# Patient Record
Sex: Female | Born: 1950 | Race: White | Hispanic: No | Marital: Married | Smoking: Never smoker
Health system: Southern US, Community
[De-identification: ages and names within clinical notes are randomized; demographics above are authoritative.]

## PROBLEM LIST (undated history)

## (undated) DIAGNOSIS — I10 Essential (primary) hypertension: Secondary | ICD-10-CM

## (undated) DIAGNOSIS — H5340 Unspecified visual field defects: Secondary | ICD-10-CM

## (undated) DIAGNOSIS — N809 Endometriosis, unspecified: Secondary | ICD-10-CM

## (undated) DIAGNOSIS — M791 Myalgia, unspecified site: Secondary | ICD-10-CM

## (undated) DIAGNOSIS — E785 Hyperlipidemia, unspecified: Secondary | ICD-10-CM

## (undated) DIAGNOSIS — R739 Hyperglycemia, unspecified: Secondary | ICD-10-CM

## (undated) DIAGNOSIS — Q430 Meckel's diverticulum (displaced) (hypertrophic): Secondary | ICD-10-CM

## (undated) DIAGNOSIS — H348392 Tributary (branch) retinal vein occlusion, unspecified eye, stable: Secondary | ICD-10-CM

## (undated) HISTORY — DX: Essential (primary) hypertension: I10

## (undated) HISTORY — DX: Hyperlipidemia, unspecified: E78.5

## (undated) HISTORY — DX: Hyperglycemia, unspecified: R73.9

## (undated) HISTORY — DX: Myalgia, unspecified site: M79.10

## (undated) HISTORY — DX: Tributary (branch) retinal vein occlusion, unspecified eye, stable: H34.8392

## (undated) HISTORY — DX: Meckel's diverticulum (displaced) (hypertrophic): Q43.0

## (undated) HISTORY — DX: Unspecified visual field defects: H53.40

## (undated) HISTORY — PX: ABDOMINAL HYSTERECTOMY: SHX81

---
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
## 1979-01-09 HISTORY — PX: BREAST LUMPECTOMY: SHX2

## 1998-12-12 ENCOUNTER — Encounter: Admission: RE | Admit: 1998-12-12 | Discharge: 1998-12-12 | Payer: Self-pay | Admitting: Psychiatry

## 1998-12-12 ENCOUNTER — Encounter: Payer: Self-pay | Admitting: Family Medicine

## 1999-01-18 ENCOUNTER — Other Ambulatory Visit: Admission: RE | Admit: 1999-01-18 | Discharge: 1999-01-18 | Payer: Self-pay | Admitting: Family Medicine

## 1999-12-27 ENCOUNTER — Encounter: Payer: Self-pay | Admitting: Family Medicine

## 1999-12-27 ENCOUNTER — Encounter: Admission: RE | Admit: 1999-12-27 | Discharge: 1999-12-27 | Payer: Self-pay | Admitting: Family Medicine

## 2002-10-20 ENCOUNTER — Encounter: Admission: RE | Admit: 2002-10-20 | Discharge: 2002-10-20 | Payer: Self-pay | Admitting: Family Medicine

## 2002-10-20 ENCOUNTER — Encounter: Payer: Self-pay | Admitting: Family Medicine

## 2004-09-21 ENCOUNTER — Encounter: Admission: RE | Admit: 2004-09-21 | Discharge: 2004-09-21 | Payer: Self-pay | Admitting: Family Medicine

## 2004-09-29 ENCOUNTER — Other Ambulatory Visit: Admission: RE | Admit: 2004-09-29 | Discharge: 2004-09-29 | Payer: Self-pay | Admitting: Family Medicine

## 2004-10-20 ENCOUNTER — Encounter: Admission: RE | Admit: 2004-10-20 | Discharge: 2004-10-20 | Payer: Self-pay | Admitting: Family Medicine

## 2005-02-28 ENCOUNTER — Emergency Department (HOSPITAL_COMMUNITY): Admission: EM | Admit: 2005-02-28 | Discharge: 2005-02-28 | Payer: Self-pay | Admitting: Emergency Medicine

## 2005-09-14 ENCOUNTER — Ambulatory Visit: Payer: Self-pay | Admitting: Family Medicine

## 2005-11-15 ENCOUNTER — Ambulatory Visit (HOSPITAL_COMMUNITY): Admission: RE | Admit: 2005-11-15 | Discharge: 2005-11-15 | Payer: Self-pay | Admitting: Family Medicine

## 2005-12-14 ENCOUNTER — Ambulatory Visit: Payer: Self-pay | Admitting: Family Medicine

## 2005-12-24 ENCOUNTER — Encounter: Payer: Self-pay | Admitting: Cardiology

## 2005-12-24 ENCOUNTER — Ambulatory Visit: Payer: Self-pay

## 2006-01-09 ENCOUNTER — Ambulatory Visit: Payer: Self-pay | Admitting: Family Medicine

## 2006-01-09 LAB — CONVERTED CEMR LAB
AST: 24 units/L (ref 0–37)
Albumin: 3.8 g/dL (ref 3.5–5.2)
Chloride: 106 meq/L (ref 96–112)
Creatinine, Ser: 0.9 mg/dL (ref 0.4–1.2)
GFR calc non Af Amer: 69 mL/min
Hgb A1c MFr Bld: 5.9 % (ref 4.6–6.0)
Potassium: 4 meq/L (ref 3.5–5.1)
Sodium: 141 meq/L (ref 135–145)

## 2006-04-18 ENCOUNTER — Ambulatory Visit: Payer: Self-pay | Admitting: Family Medicine

## 2006-09-16 ENCOUNTER — Ambulatory Visit: Payer: Self-pay | Admitting: Family Medicine

## 2006-09-16 DIAGNOSIS — E785 Hyperlipidemia, unspecified: Secondary | ICD-10-CM

## 2006-09-16 DIAGNOSIS — R7309 Other abnormal glucose: Secondary | ICD-10-CM | POA: Insufficient documentation

## 2006-09-16 DIAGNOSIS — I10 Essential (primary) hypertension: Secondary | ICD-10-CM | POA: Insufficient documentation

## 2006-09-19 LAB — CONVERTED CEMR LAB
ALT: 29 units/L (ref 0–35)
Albumin: 3.8 g/dL (ref 3.5–5.2)
BUN: 16 mg/dL (ref 6–23)
Bilirubin, Direct: 0.1 mg/dL (ref 0.0–0.3)
Calcium: 9.4 mg/dL (ref 8.4–10.5)
Chloride: 110 meq/L (ref 96–112)
Cholesterol: 205 mg/dL (ref 0–200)
GFR calc Af Amer: 95 mL/min
HDL: 37.2 mg/dL — ABNORMAL LOW (ref >39.0)
Hgb A1c MFr Bld: 6 % (ref 4.6–6.0)
Total Protein: 6.9 g/dL (ref 6.0–8.3)
Triglycerides: 289 mg/dL (ref 0–149)
VLDL: 58 mg/dL — ABNORMAL HIGH (ref 0–40)

## 2006-11-18 ENCOUNTER — Telehealth (INDEPENDENT_AMBULATORY_CARE_PROVIDER_SITE_OTHER): Payer: Self-pay | Admitting: *Deleted

## 2006-12-18 ENCOUNTER — Ambulatory Visit (HOSPITAL_COMMUNITY): Admission: RE | Admit: 2006-12-18 | Discharge: 2006-12-18 | Payer: Self-pay | Admitting: Family Medicine

## 2006-12-19 ENCOUNTER — Telehealth (INDEPENDENT_AMBULATORY_CARE_PROVIDER_SITE_OTHER): Payer: Self-pay | Admitting: *Deleted

## 2006-12-30 ENCOUNTER — Encounter (INDEPENDENT_AMBULATORY_CARE_PROVIDER_SITE_OTHER): Payer: Self-pay | Admitting: *Deleted

## 2007-02-17 ENCOUNTER — Ambulatory Visit: Payer: Self-pay | Admitting: Family Medicine

## 2007-02-25 ENCOUNTER — Ambulatory Visit: Payer: Self-pay | Admitting: Family Medicine

## 2007-03-12 LAB — CONVERTED CEMR LAB
ALT: 31 units/L (ref 0–35)
Albumin: 3.7 g/dL (ref 3.5–5.2)
Alkaline Phosphatase: 64 units/L (ref 39–117)
Bilirubin, Direct: 0.1 mg/dL (ref 0.0–0.3)
CO2: 27 meq/L (ref 19–32)
Calcium: 9.5 mg/dL (ref 8.4–10.5)
Cholesterol: 193 mg/dL (ref 0–200)
Direct LDL: 63.6 mg/dL
Hgb A1c MFr Bld: 6 % (ref 4.6–6.0)
Total CHOL/HDL Ratio: 5.1

## 2007-07-09 ENCOUNTER — Telehealth (INDEPENDENT_AMBULATORY_CARE_PROVIDER_SITE_OTHER): Payer: Self-pay | Admitting: *Deleted

## 2007-07-16 ENCOUNTER — Ambulatory Visit: Payer: Self-pay | Admitting: Family Medicine

## 2007-07-27 LAB — CONVERTED CEMR LAB
Albumin: 3.7 g/dL (ref 3.5–5.2)
Alkaline Phosphatase: 71 units/L (ref 39–117)
Calcium: 9.3 mg/dL (ref 8.4–10.5)
Creatinine, Ser: 0.8 mg/dL (ref 0.4–1.2)
GFR calc Af Amer: 95 mL/min
GFR calc non Af Amer: 79 mL/min
Glucose, Bld: 108 mg/dL — ABNORMAL HIGH (ref 70–99)
HDL: 37.8 mg/dL — ABNORMAL LOW (ref >39.0)
Potassium: 4 meq/L (ref 3.5–5.1)
Total Bilirubin: 0.7 mg/dL (ref 0.3–1.2)
Total CHOL/HDL Ratio: 5.9
Total Protein: 6.5 g/dL (ref 6.0–8.3)

## 2007-07-28 ENCOUNTER — Encounter (INDEPENDENT_AMBULATORY_CARE_PROVIDER_SITE_OTHER): Payer: Self-pay | Admitting: *Deleted

## 2007-08-15 ENCOUNTER — Telehealth (INDEPENDENT_AMBULATORY_CARE_PROVIDER_SITE_OTHER): Payer: Self-pay | Admitting: *Deleted

## 2007-11-28 ENCOUNTER — Telehealth (INDEPENDENT_AMBULATORY_CARE_PROVIDER_SITE_OTHER): Payer: Self-pay | Admitting: *Deleted

## 2008-01-12 ENCOUNTER — Encounter (INDEPENDENT_AMBULATORY_CARE_PROVIDER_SITE_OTHER): Payer: Self-pay | Admitting: *Deleted

## 2008-01-28 ENCOUNTER — Ambulatory Visit: Payer: Self-pay | Admitting: Family Medicine

## 2008-01-28 DIAGNOSIS — H5340 Unspecified visual field defects: Secondary | ICD-10-CM | POA: Insufficient documentation

## 2008-01-28 DIAGNOSIS — IMO0001 Reserved for inherently not codable concepts without codable children: Secondary | ICD-10-CM | POA: Insufficient documentation

## 2008-01-29 LAB — CONVERTED CEMR LAB
Alkaline Phosphatase: 57 units/L (ref 39–117)
BUN: 19 mg/dL (ref 6–23)
Basophils Relative: 2.3 % (ref 0.0–3.0)
Bilirubin, Direct: 0.1 mg/dL (ref 0.0–0.3)
Cholesterol: 239 mg/dL (ref 0–200)
Direct LDL: 90 mg/dL
Eosinophils Absolute: 0.1 10*3/uL (ref 0.0–0.7)
Eosinophils Relative: 2.1 % (ref 0.0–5.0)
GFR calc Af Amer: 83 mL/min
GFR calc non Af Amer: 68 mL/min
Glucose, Bld: 108 mg/dL — ABNORMAL HIGH (ref 70–99)
HCT: 39.2 % (ref 36.0–46.0)
MCHC: 34.9 g/dL (ref 30.0–36.0)
MCV: 88.9 fL (ref 78.0–100.0)
Monocytes Relative: 8.6 % (ref 3.0–12.0)
Neutrophils Relative %: 58.3 % (ref 43.0–77.0)
Platelets: 309 10*3/uL (ref 150–400)
RDW: 12.9 % (ref 11.5–14.6)
Total Bilirubin: 0.9 mg/dL (ref 0.3–1.2)
Total Protein: 6.5 g/dL (ref 6.0–8.3)
WBC: 5.7 10*3/uL (ref 4.5–10.5)

## 2008-01-30 ENCOUNTER — Encounter (INDEPENDENT_AMBULATORY_CARE_PROVIDER_SITE_OTHER): Payer: Self-pay | Admitting: *Deleted

## 2008-01-30 ENCOUNTER — Telehealth (INDEPENDENT_AMBULATORY_CARE_PROVIDER_SITE_OTHER): Payer: Self-pay | Admitting: *Deleted

## 2008-02-06 ENCOUNTER — Encounter: Payer: Self-pay | Admitting: Family Medicine

## 2008-02-10 ENCOUNTER — Telehealth: Payer: Self-pay | Admitting: Family Medicine

## 2008-02-18 ENCOUNTER — Telehealth (INDEPENDENT_AMBULATORY_CARE_PROVIDER_SITE_OTHER): Payer: Self-pay | Admitting: *Deleted

## 2008-03-10 ENCOUNTER — Ambulatory Visit: Payer: Self-pay | Admitting: Family Medicine

## 2008-03-18 ENCOUNTER — Encounter (INDEPENDENT_AMBULATORY_CARE_PROVIDER_SITE_OTHER): Payer: Self-pay | Admitting: *Deleted

## 2008-07-19 ENCOUNTER — Telehealth (INDEPENDENT_AMBULATORY_CARE_PROVIDER_SITE_OTHER): Payer: Self-pay | Admitting: *Deleted

## 2008-09-28 ENCOUNTER — Ambulatory Visit (HOSPITAL_COMMUNITY): Admission: RE | Admit: 2008-09-28 | Discharge: 2008-09-28 | Payer: Self-pay | Admitting: Family Medicine

## 2008-11-11 ENCOUNTER — Encounter: Payer: Self-pay | Admitting: Family Medicine

## 2008-11-11 ENCOUNTER — Ambulatory Visit: Payer: Self-pay | Admitting: Family Medicine

## 2008-11-11 ENCOUNTER — Other Ambulatory Visit: Admission: RE | Admit: 2008-11-11 | Discharge: 2008-11-11 | Payer: Self-pay | Admitting: Family Medicine

## 2008-11-11 DIAGNOSIS — H348392 Tributary (branch) retinal vein occlusion, unspecified eye, stable: Secondary | ICD-10-CM

## 2008-11-11 DIAGNOSIS — E559 Vitamin D deficiency, unspecified: Secondary | ICD-10-CM | POA: Insufficient documentation

## 2008-11-11 DIAGNOSIS — Q430 Meckel's diverticulum (displaced) (hypertrophic): Secondary | ICD-10-CM

## 2008-11-11 LAB — HM PAP SMEAR

## 2008-11-12 ENCOUNTER — Encounter: Payer: Self-pay | Admitting: Family Medicine

## 2008-11-15 ENCOUNTER — Encounter (INDEPENDENT_AMBULATORY_CARE_PROVIDER_SITE_OTHER): Payer: Self-pay | Admitting: *Deleted

## 2008-11-17 ENCOUNTER — Telehealth (INDEPENDENT_AMBULATORY_CARE_PROVIDER_SITE_OTHER): Payer: Self-pay | Admitting: *Deleted

## 2009-02-21 ENCOUNTER — Telehealth (INDEPENDENT_AMBULATORY_CARE_PROVIDER_SITE_OTHER): Payer: Self-pay | Admitting: *Deleted

## 2009-05-30 ENCOUNTER — Telehealth (INDEPENDENT_AMBULATORY_CARE_PROVIDER_SITE_OTHER): Payer: Self-pay | Admitting: *Deleted

## 2009-06-03 ENCOUNTER — Ambulatory Visit: Payer: Self-pay | Admitting: Family Medicine

## 2009-06-09 LAB — CONVERTED CEMR LAB
ALT: 42 units/L — ABNORMAL HIGH (ref 0–35)
Albumin: 4 g/dL (ref 3.5–5.2)
BUN: 18 mg/dL (ref 6–23)
Bilirubin, Direct: 0 mg/dL (ref 0.0–0.3)
Cholesterol: 183 mg/dL (ref 0–200)
Creatinine, Ser: 0.8 mg/dL (ref 0.4–1.2)
Hgb A1c MFr Bld: 6.1 % (ref 4.6–6.5)
Sodium: 144 meq/L (ref 135–145)
Total Bilirubin: 0.3 mg/dL (ref 0.3–1.2)
Total Protein: 6.7 g/dL (ref 6.0–8.3)

## 2009-10-18 ENCOUNTER — Encounter: Payer: Self-pay | Admitting: Family Medicine

## 2009-10-19 ENCOUNTER — Telehealth (INDEPENDENT_AMBULATORY_CARE_PROVIDER_SITE_OTHER): Payer: Self-pay | Admitting: *Deleted

## 2009-10-19 ENCOUNTER — Encounter: Payer: Self-pay | Admitting: Family Medicine

## 2009-10-19 ENCOUNTER — Encounter (INDEPENDENT_AMBULATORY_CARE_PROVIDER_SITE_OTHER): Payer: Self-pay | Admitting: *Deleted

## 2009-10-19 ENCOUNTER — Ambulatory Visit: Payer: Self-pay | Admitting: Family Medicine

## 2009-10-19 DIAGNOSIS — R002 Palpitations: Secondary | ICD-10-CM

## 2009-10-19 DIAGNOSIS — R05 Cough: Secondary | ICD-10-CM

## 2009-10-19 DIAGNOSIS — R059 Cough, unspecified: Secondary | ICD-10-CM | POA: Insufficient documentation

## 2009-10-20 ENCOUNTER — Ambulatory Visit (HOSPITAL_COMMUNITY): Admission: RE | Admit: 2009-10-20 | Discharge: 2009-10-20 | Payer: Self-pay | Admitting: Family Medicine

## 2009-10-20 LAB — CONVERTED CEMR LAB
ALT: 25 units/L (ref 0–35)
Alkaline Phosphatase: 55 units/L (ref 39–117)
Bilirubin, Direct: 0.1 mg/dL (ref 0.0–0.3)
Cholesterol: 160 mg/dL (ref 0–200)
GFR calc non Af Amer: 68.78 mL/min (ref >60)
HCT: 39.4 % (ref 36.0–46.0)
HDL: 42 mg/dL (ref >39.00)
Hemoglobin: 13.4 g/dL (ref 12.0–15.0)
Lymphocytes Relative: 33.4 % (ref 12.0–46.0)
Lymphs Abs: 2.3 10*3/uL (ref 0.7–4.0)
MCHC: 34.1 g/dL (ref 30.0–36.0)
MCV: 90 fL (ref 78.0–100.0)
Monocytes Relative: 8.9 % (ref 3.0–12.0)
Neutrophils Relative %: 55.6 % (ref 43.0–77.0)
Platelets: 342 10*3/uL (ref 150.0–400.0)
Potassium: 4.1 meq/L (ref 3.5–5.1)
RBC: 4.38 M/uL (ref 3.87–5.11)
RDW: 13.9 % (ref 11.5–14.6)
T3, Free: 3 pg/mL (ref 2.3–4.2)
TSH: 1.54 microintl units/mL (ref 0.35–5.50)
Total Bilirubin: 0.3 mg/dL (ref 0.3–1.2)
Total CHOL/HDL Ratio: 4
Total Protein: 6.7 g/dL (ref 6.0–8.3)
Triglycerides: 198 mg/dL — ABNORMAL HIGH (ref 0.0–149.0)
VLDL: 39.6 mg/dL (ref 0.0–40.0)
Vitamin B-12: 338 pg/mL (ref 211–911)
WBC: 6.8 10*3/uL (ref 4.5–10.5)

## 2009-10-20 IMAGING — CR DG CHEST 2V
2 series · 2 of 2 positions shown · non-contrast
Comparison: None.

CLINICAL DATA: Cough.  Fever.  Palpitations.

CHEST - 2 VIEW [DATE]:

[view not recorded (1 of 2)]
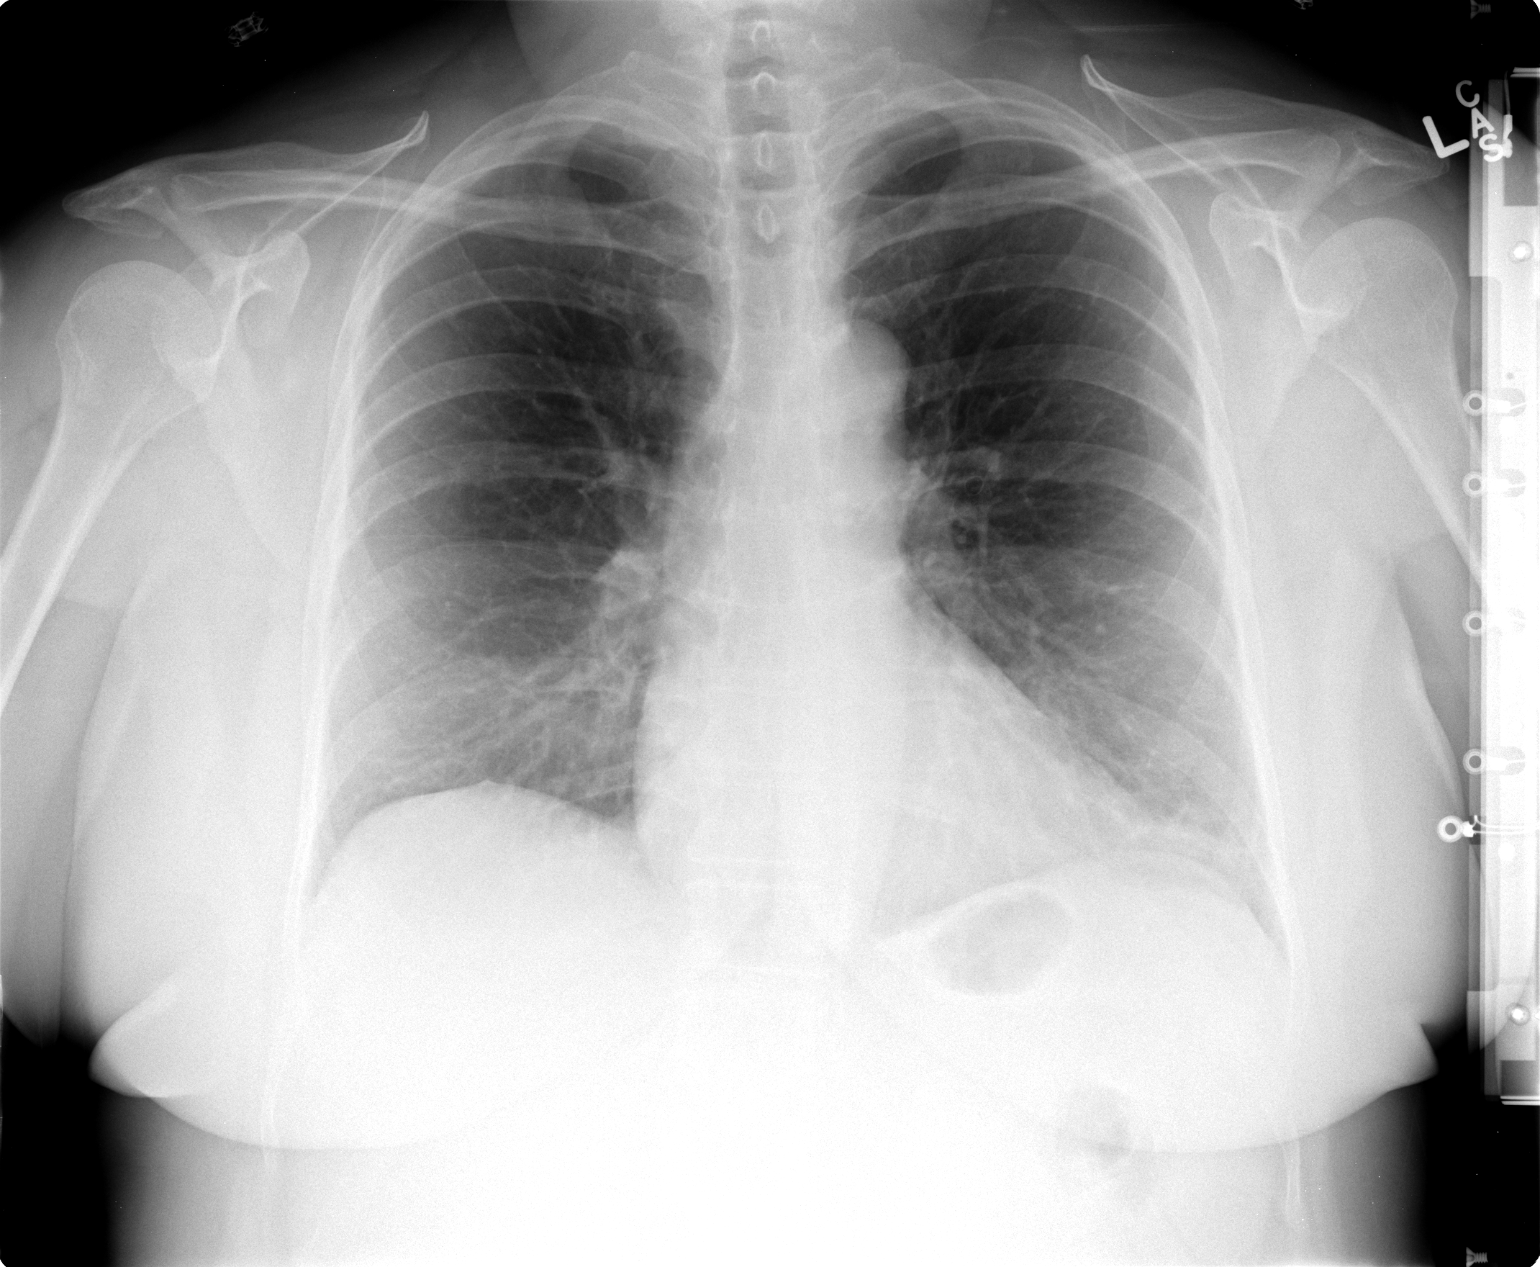

[view not recorded (2 of 2)]
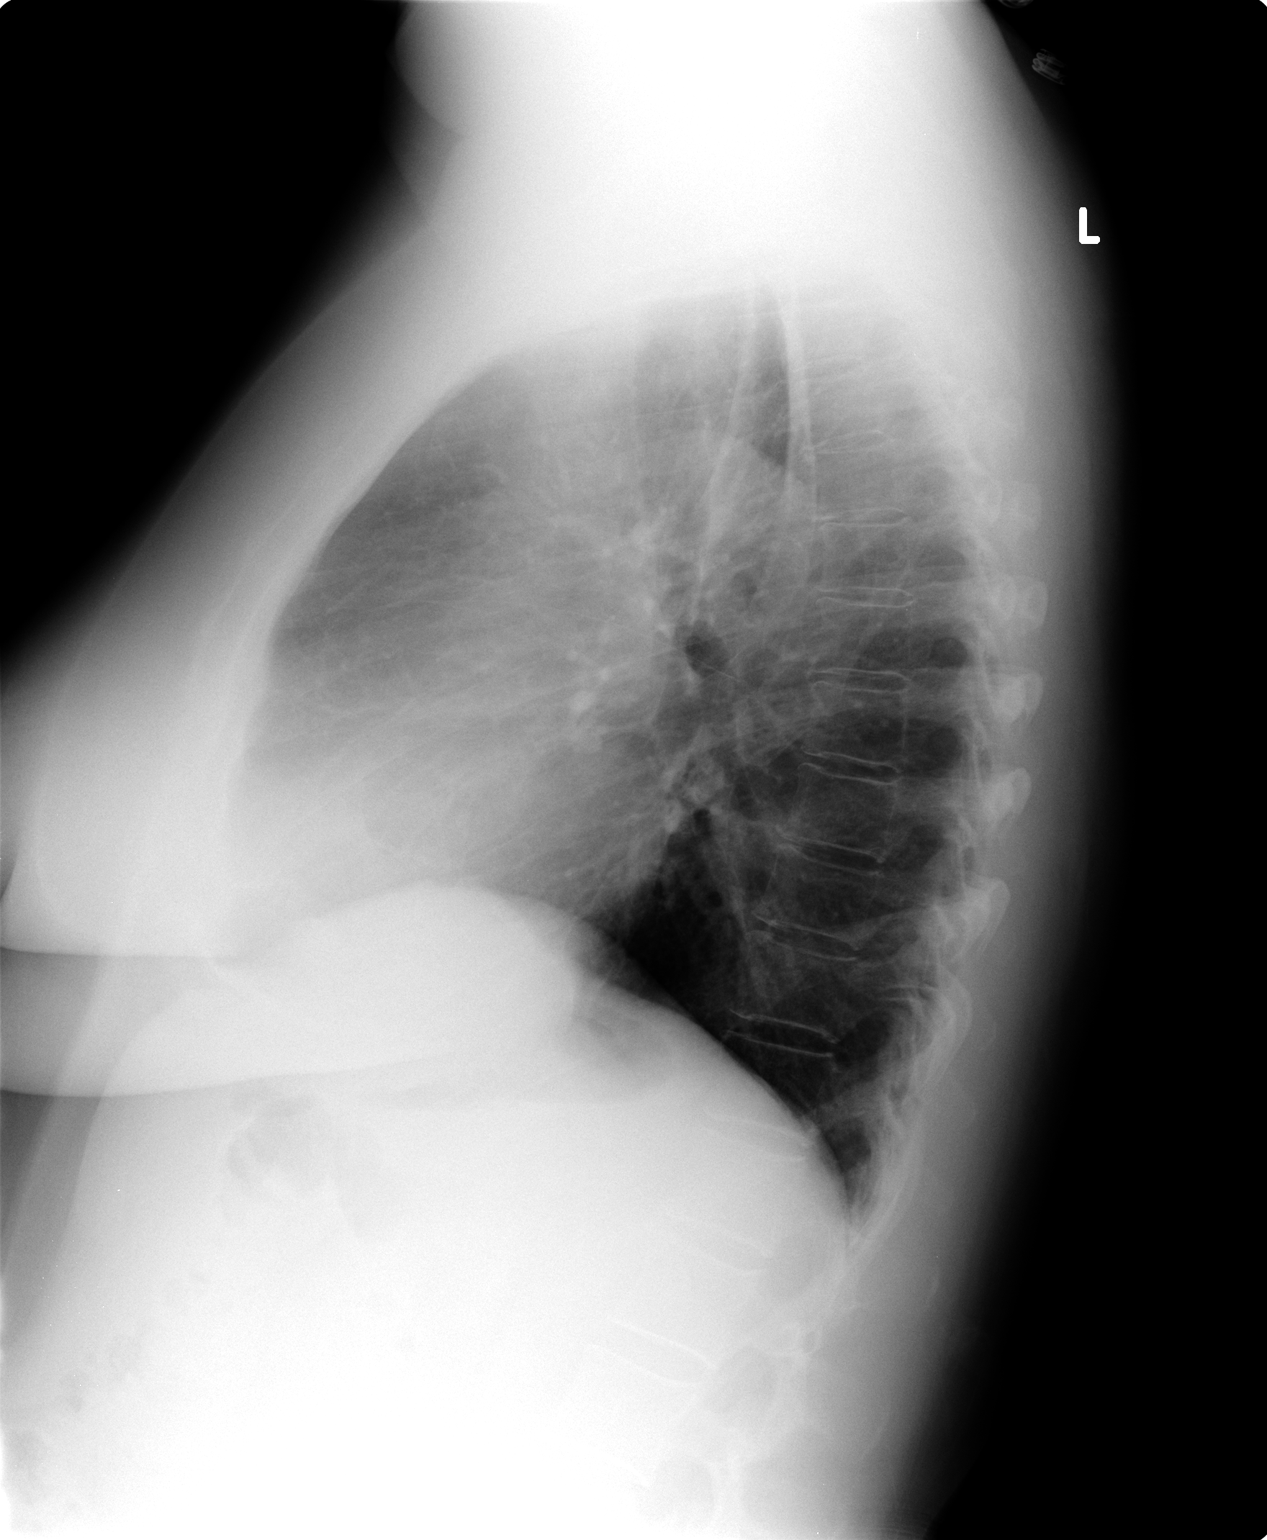

[2 of 2 positions shown; findings below may reference images not displayed]

FINDINGS: Cardiomediastinal silhouette unremarkable.  Linear
atelectasis or scarring in the lingula.  Lungs otherwise clear.  No
pleural effusions.  Visualized bony thorax intact.
IMPRESSION: Linear atelectasis or scarring in the lingula.  No acute
cardiopulmonary disease otherwise.

## 2009-10-21 ENCOUNTER — Ambulatory Visit: Payer: Self-pay | Admitting: Family Medicine

## 2009-10-27 ENCOUNTER — Encounter: Payer: Self-pay | Admitting: Family Medicine

## 2009-10-27 ENCOUNTER — Ambulatory Visit (HOSPITAL_COMMUNITY): Admission: RE | Admit: 2009-10-27 | Discharge: 2009-10-27 | Payer: Self-pay | Admitting: Family Medicine

## 2009-10-27 LAB — HM MAMMOGRAPHY: HM Mammogram: NEGATIVE

## 2009-11-04 ENCOUNTER — Ambulatory Visit: Payer: Self-pay | Admitting: Internal Medicine

## 2009-11-04 DIAGNOSIS — R0989 Other specified symptoms and signs involving the circulatory and respiratory systems: Secondary | ICD-10-CM

## 2009-11-08 ENCOUNTER — Ambulatory Visit: Payer: Self-pay | Admitting: Family Medicine

## 2009-11-09 LAB — CONVERTED CEMR LAB: Fecal Occult Bld: NEGATIVE

## 2009-11-18 ENCOUNTER — Ambulatory Visit (HOSPITAL_COMMUNITY): Admission: RE | Admit: 2009-11-18 | Discharge: 2009-11-18 | Payer: Self-pay | Admitting: Internal Medicine

## 2009-11-18 ENCOUNTER — Ambulatory Visit: Payer: Self-pay

## 2009-11-18 ENCOUNTER — Ambulatory Visit: Payer: Self-pay | Admitting: Internal Medicine

## 2009-11-18 ENCOUNTER — Ambulatory Visit: Payer: Self-pay | Admitting: Cardiology

## 2009-11-18 ENCOUNTER — Encounter: Payer: Self-pay | Admitting: Internal Medicine

## 2009-11-22 ENCOUNTER — Telehealth: Payer: Self-pay | Admitting: Internal Medicine

## 2009-11-28 ENCOUNTER — Telehealth: Payer: Self-pay | Admitting: Internal Medicine

## 2010-01-29 ENCOUNTER — Encounter: Payer: Self-pay | Admitting: Family Medicine

## 2010-02-05 LAB — CONVERTED CEMR LAB
ALT: 24 units/L (ref 0–35)
Albumin: 4.1 g/dL (ref 3.5–5.2)
Alkaline Phosphatase: 54 units/L (ref 39–117)
BUN: 17 mg/dL (ref 6–23)
Basophils Relative: 0.7 % (ref 0.0–3.0)
Bilirubin, Direct: 0 mg/dL (ref 0.0–0.3)
CO2: 26 meq/L (ref 19–32)
Calcium: 9.6 mg/dL (ref 8.4–10.5)
Creatinine, Ser: 0.8 mg/dL (ref 0.4–1.2)
HCT: 38.5 % (ref 36.0–46.0)
HDL: 45.8 mg/dL (ref >39.00)
Hemoglobin: 13.4 g/dL (ref 12.0–15.0)
Hgb A1c MFr Bld: 6.1 % (ref 4.6–6.5)
Ketones, urine, test strip: NEGATIVE
Lymphs Abs: 2.2 10*3/uL (ref 0.7–4.0)
MCV: 89.1 fL (ref 78.0–100.0)
Microalb, Ur: 0.6 mg/dL (ref 0.0–1.9)
Neutro Abs: 3.8 10*3/uL (ref 1.4–7.7)
Platelets: 374 10*3/uL (ref 150.0–400.0)
Protein, U semiquant: NEGATIVE
RBC: 4.32 M/uL (ref 3.87–5.11)
RDW: 12.7 % (ref 11.5–14.6)
Total Bilirubin: 0.6 mg/dL (ref 0.3–1.2)
Total Protein: 7.1 g/dL (ref 6.0–8.3)
Triglycerides: 195 mg/dL — ABNORMAL HIGH (ref 0.0–149.0)
VLDL: 39 mg/dL (ref 0.0–40.0)
WBC: 6.6 10*3/uL (ref 4.5–10.5)
pH: 5

## 2010-02-09 NOTE — Progress Notes (Signed)
Summary: refill-lowne  Phone Note Refill Request Call back at Home Phone [phone] Call back at Work Phone [phone] Call back at ext [phone] Message from:  [facility]  Refills Requested: Medication #1:  aspirin 81mg  1 tab qd Initial call taken by: Jeremy Johann CMA,  February 21, 2009 3:13 PM  Follow-up for Phone Call        Pt is aware. Army Fossa CMA  February 21, 2009 3:25 PM     New/Updated Medications: ASPIR-LOW 81 MG TBEC (ASPIRIN) 1 by mouth daily. Prescriptions: ASPIR-LOW 81 MG TBEC (ASPIRIN) 1 by mouth daily.  #30 x 5   Entered by:   Army Fossa CMA   Authorized by:   Loreen Freud DO   Signed by:   Army Fossa CMA on 02/21/2009   Method used:   Electronically to        [facility]* (retail)       [address]       [address] Shipping/mailing       [place], [place]  [address]       Ph: [phone]       Fax: [phone]   RxID:   [id number]

## 2010-02-09 NOTE — Letter (Signed)
Summary: [facility]: Immunoassay Fecal Occult Blood (iFOB) Order Form  [facility] [place]  [address] [place], [place] [address]   Phone: [phone]  Fax: [phone]      [facility]: Immunoassay Fecal Occult Blood (iFOB) Order Form   October 19, 2009 MRN: [id number]   IRIANNA GILDAY February 06, 1950   Physicican Name: Dr.Lowne  Diagnosis Code: V56.71      Almeta Monas CMA (AAMA)

## 2010-02-09 NOTE — Assessment & Plan Note (Signed)
Summary: np6/.recurrant palps   Visit Type:  Follow-up Primary Rondy Krupinski:  Laury Axon   History of Present Illness: patient is a 37  who was referred for palpitations. She had her first spell in 2006.  She woke up to go to the bathroom.  When she got back to bed her heat was racing.  It eased off eventually on its own.   In March of 2011  at about 1:30 AM she had another episode.   It lasted for about 6 hrs.  She "felt like I had a bubble in my chest"  In August she had her last episode that occurred.  It lasted about 20 mins. She has also had other episodes that sound different.  She says if she is walking up hills she will feel her heart race.  Not lke the other spells though. She is currently wearing monitor. She denies syncope.  No real chest pressure.  Does have SOB.  Current Medications (verified): 1)  Cardizem Cd 240 Mg Cp24 (Diltiazem Hcl Coated Beads) .... Take 1 Capsule By Mouth Once A Day 2)  Atenolol 50 Mg  Tabs (Atenolol) .Marland Kitchen.. 1 By Mouth Once Daily 3)  Hydrochlorothiazide 25 Mg  Tabs (Hydrochlorothiazide) .... 1/2 Tab By Mouth Once Daily 4)  Fenofibrate 160 Mg  Tabs (Fenofibrate) .Marland Kitchen.. 1 By Mouth Once Daily 5)  Vitamin D3 1000 Unit Caps (Cholecalciferol) .... By Mouth Once Daily 6)  Pravachol 20 Mg Tabs (Pravastatin Sodium) .... Take One Tablet At Bedtime 7)  Aspir-Low 81 Mg Tbec (Aspirin) .Marland Kitchen.. 1 By Mouth Daily. 8)  Fish Oil   Oil (Fish Oil) .... Once Daily 9)  Multivitamins   Tabs (Multiple Vitamin) .... Once Daily  Allergies (verified): No Known Drug Allergies  Past History:  Past medical, surgical, family and social histories (including risk factors) reviewed, and no changes noted (except as noted below).  Past Medical History: Reviewed history from 11/11/2008 and no changes required. 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 Family History Diabetes 1st degree relative Family History High cholesterol Family History Hypertension Family History of Arthritis Family History of CAD Female 1st degree relative 61 yo MGF-- prostate ca Family History of Alcoholism/Addiction Family History Depression Mother passed away May 2011--77yo--dementia, alz,  GI bleed, ?stroke  Social History: Reviewed history from 11/11/2008 and no changes required. 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  Vital Signs:  Patient profile:   60 year old female Menstrual status:  hysterectomy Height:      61 inches Weight:      186 pounds BMI:     35.27 Pulse rate:   78 / minute BP sitting:   120 / 80  (left arm)  Vitals Entered By: Laurance Flatten CMA (November 04, 2009 12:13 PM)  Physical Exam  Additional Exam:  patient is in NAD HEENT:  Normocephalic, atraumatic. EOMI, PERRLA.  Neck: JVP is normal. No thyromegaly. Murmur R carotid. Lungs: clear to auscultation. No rales no wheezes.  Heart: Regular rate and rhythm. Normal S1, S2. No S3.   No significant murmurs. PMI not displaced.  Abdomen:  Supple, nontender. Normal bowel sounds. No masses. No hepatomegaly.  Extremities:   Good distal pulses throughout. No lower extremity edema.  Musculoskeletal :moving all extremities.  Neuro:   alert and oriented  x3.    Impression & Recommendations:  Problem # 1:  PALPITATIONS, RECURRENT (ICD-785.1) patient appears to have 2 different types of palpitations.  The 3 episodes she has had in the middle of the night are sspicious for some form of arrhythmia.  The other spells while walking may indeed just represent sinus tach. She is wearing a monitor now.  She has not had any of spells like in 2006 and 3/11, 8/11.  Review of the strips so far show only SR/ST.  I encouraged her to continue to use the monitor until it is due. I would schedule her for an echo given her history of some SOB. Her updated medication list for this problem includes:    Cardizem Cd 240 Mg Cp24 (Diltiazem hcl coated beads) .Marland Kitchen... Take 1 capsule by mouth once a day    Atenolol 50 Mg Tabs (Atenolol) .Marland Kitchen... 1 by mouth once daily    Aspir-low 81 Mg Tbec (Aspirin) .Marland Kitchen... 1 by mouth daily.  Orders: Echocardiogram (Echo)  Problem # 2:  CAROTID BRUIT (ICD-785.9) Murmur over R neck.  will schedule for carotid USN Orders: Carotid Duplex (Carotid Duplex)  Problem # 3:  HYPERTENSION (ICD-401.9) COntinue meds. Her updated medication list for this problem includes:    Cardizem Cd 240 Mg Cp24 (Diltiazem hcl coated beads) .Marland Kitchen... Take 1 capsule by mouth once a day    Atenolol 50 Mg Tabs (Atenolol) .Marland Kitchen... 1 by mouth once daily    Hydrochlorothiazide 25 Mg Tabs (Hydrochlorothiazide) .Marland Kitchen... 1/2 tab by mouth once daily    Aspir-low 81 Mg Tbec (Aspirin) .Marland Kitchen... 1 by mouth daily.  Problem # 4:  HYPERLIPIDEMIA (ICD-272.4) Will need to follow. Her updated medication list for this problem includes:    Fenofibrate 160 Mg Tabs (Fenofibrate) .Marland Kitchen... 1 by mouth once daily    Pravachol 20 Mg Tabs (Pravastatin sodium) .Marland Kitchen... Take one tablet at bedtime  Patient Instructions: 1)  Your physician has requested that you have a carotid duplex. This test is an ultrasound of the carotid arteries in your neck. It looks at blood flow through these arteries that supply  the brain with blood. Allow one hour for this exam. There are no restrictions or special instructions. 2)  Your physician has requested that you have an echocardiogram.  Echocardiography is a painless test that uses sound waves to create images of your heart. It provides your doctor with information about the size and shape of your heart and how well your heart's chambers and valves are working.  This procedure takes approximately one hour. There are no restrictions for this procedure.   Appended Document: np6/.recurrant palps [facility] sleep eval.

## 2010-02-09 NOTE — Letter (Signed)
Summary: Primary Care Consult Scheduled Letter  [facility] [place]  [address] [place], [place] [address]   Phone: [phone]  Fax: [phone]      10/19/2009 MRN: [id number]  ALFREDO COLLYMORE [address] [place], [place]  [address]    Dear Ms. Habeeb,    We have scheduled an appointment for you.  At the recommendation of Dr. Loreen Freud, we have scheduled you a consult with Dr. Dietrich Pates of [facility] on 11-04-2009 at 12:00pm.  Their address is [address], [address] floor, [place] [address]. The office phone number is [phone].  If this appointment day and time is not convenient for you, please feel free to call the office of the doctor you are being referred to at the number listed above and reschedule the appointment.    It is important for you to keep your scheduled appointments. We are here to make sure you are given good patient care.   Thank you,    Renee, Patient Care Coordinator [place] [facility]

## 2010-02-09 NOTE — Assessment & Plan Note (Signed)
Summary: CPX,FASTING,NO PAP,UMR/RH.....   Vital Signs:  Patient profile:   60 year old female Menstrual status:  hysterectomy Height:      61 inches Weight:      186.4 pounds BMI:     35.35 Temp:     99.3 degrees F oral Pulse rate:   80 / minute Pulse rhythm:   regular BP sitting:   118 / 80  (left arm) Cuff size:   large  Vitals Entered By: Almeta Monas CMA Duncan Dull) (October 19, 2009 8:39 AM) CC: cpx/fasting     Menstrual Status hysterectomy Last PAP Result NEGATIVE FOR INTRAEPITHELIAL LESIONS OR MALIGNANCY.   History of Present Illness: Pt here for cpe.  Pt states she had an episode in march when she went to [place] to get her mom admitted into hospital,  where here heart went out of rhythm and it lasted about 7 hours and then it went away.  She took a baby aspirin and walked the floor and it eventually went away.    Current Medications (verified): 1)  Cardizem Cd 240 Mg Cp24 (Diltiazem Hcl Coated Beads) .... Take 1 Capsule By Mouth Once A Day 2)  Atenolol 50 Mg  Tabs (Atenolol) .Marland Kitchen.. 1 By Mouth Once Daily 3)  Hydrochlorothiazide 25 Mg  Tabs (Hydrochlorothiazide) .... 1/2 Tab By Mouth Once Daily 4)  Fenofibrate 160 Mg  Tabs (Fenofibrate) .Marland Kitchen.. 1 By Mouth Once Daily 5)  Vitamin D3 1000 Unit Caps (Cholecalciferol) .... By Mouth Once Daily 6)  Pravachol 20 Mg Tabs (Pravastatin Sodium) .... Take One Tablet At Bedtime 7)  Aspir-Low 81 Mg Tbec (Aspirin) .Marland Kitchen.. 1 By Mouth Daily. 8)  Fish Oil 1000 Mg Caps (Omega-3 Fatty Acids) .... By Mouth Once Daily  Allergies (verified): No Known Drug Allergies  Family History: Reviewed history from 11/11/2008 and no changes required. Family History Diabetes 1st degree relative Family History High cholesterol Family History Hypertension Family History of Arthritis Family History of CAD Female 1st degree relative 61 yo MGF-- prostate ca Family History of Alcoholism/Addiction Family History Depression Mother passed away May  2011--77yo--dementia, alz,  GI bleed, ?stroke  Social History: Reviewed history from 11/11/2008 and no changes required. 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 Take 1 capsule by mouth once a day 2)  Atenolol 50 Mg Tabs (Atenolol) .Marland Kitchen.. 1 by mouth once daily 3)  Hydrochlorothiazide 25 Mg Tabs (Hydrochlorothiazide) .... 1/2 tab by mouth once daily 4)  Fenofibrate 160 Mg Tabs (Fenofibrate) .Marland Kitchen.. 1 by mouth once daily 5)  Vitamin D3 1000 Unit Caps (Cholecalciferol) .... By mouth once daily 6)  Pravachol 20 Mg Tabs (Pravastatin sodium) .... Take one tablet at bedtime 7)  Aspir-low 81 Mg Tbec (Aspirin) .Marland Kitchen.. 1 by mouth daily. 8)  Fish Oil 1000 Mg Caps (Omega-3 fatty acids) .... By mouth once daily  Appended Document: CPX,FASTING,NO PAP,UMR/RH.....     History of Present Illness:       see originally ov note---pt has been having several of these episodes where heart is out of rhythm.  In march she does admit to drinking two glasses of coke which she normally does not do.    Preventive Screening-Counseling & Management  Alcohol-Tobacco     Alcohol drinks/day: 0     Smoking Status: never  Caffeine-Diet-Exercise     Caffeine use/day: 0     Does Patient Exercise:  yes     Type of exercise: treadmill     Times/week: <3     Exercise Counseling: to improve exercise regimen  Hep-HIV-STD-Contraception     Dental Visit-last 6 months yes     Dental Care Counseling: not indicated; dental care within six months     SBE monthly: yes     SBE Education/Counseling: not indicated; SBE done regularly  Safety-Violence-Falls     Seat Belt Use: yes  Current Medications (verified): 1)  Cardizem Cd 240 Mg Cp24 (Diltiazem Hcl Coated Beads) .... Take 1 Capsule By Mouth Once A Day 2)  Atenolol 50 Mg  Tabs (Atenolol) .Marland Kitchen.. 1 By Mouth  Once Daily 3)  Hydrochlorothiazide 25 Mg  Tabs (Hydrochlorothiazide) .... 1/2 Tab By Mouth Once Daily 4)  Fenofibrate 160 Mg  Tabs (Fenofibrate) .Marland Kitchen.. 1 By Mouth Once Daily 5)  Vitamin D3 1000 Unit Caps (Cholecalciferol) .... By Mouth Once Daily 6)  Pravachol 20 Mg Tabs (Pravastatin Sodium) .... Take One Tablet At Bedtime 7)  Aspir-Low 81 Mg Tbec (Aspirin) .Marland Kitchen.. 1 By Mouth Daily. 8)  Fish Oil 1000 Mg Caps (Omega-3 Fatty Acids) .... 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) FASTING HYPERGLYCEMIA (ICD-790.29) HYPERTENSION (ICD-401.9) HYPERLIPIDEMIA (ICD-272.4)  Family History: Last updated: 10/19/2009 Family History Diabetes 1st degree relative Family History High cholesterol Family History Hypertension Family History of Arthritis Family History of CAD Female 1st degree relative 61 yo MGF-- prostate ca Family History of Alcoholism/Addiction Family History Depression Mother passed away May 2011--77yo--dementia, alz,  GI bleed, ?stroke  Social History: Last updated: 11/11/2008 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 Family History Diabetes 1st degree relative Family History High cholesterol Family History Hypertension Family History of Arthritis Family History of CAD Female 1st degree relative 61 yo MGF-- prostate ca Family History of Alcoholism/Addiction Family History Depression Mother passed away May 2011--77yo--dementia, alz,  GI bleed, ?stroke  Social History: Reviewed history from 11/11/2008 and no changes required. 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 Eyes:  Denies blurring, discharge, double vision, eye irritation, eye pain, halos, itching, light sensitivity, red eye, vision loss-1 eye, and vision loss-both eyes; optno. ENT:  Denies decreased hearing, difficulty swallowing, ear discharge, earache, hoarseness, nasal congestion, nosebleeds, postnasal drainage, ringing in ears, sinus pressure, and sore throat. CV:  Denies bluish discoloration of lips or nails, chest pain or discomfort, difficulty breathing at night, difficulty breathing while lying down, fainting, fatigue, leg cramps with exertion, lightheadness, near fainting, palpitations, shortness of breath with exertion, swelling of feet, swelling of hands, and weight gain. Resp:  Denies chest discomfort, chest pain with inspiration, cough, coughing up blood, excessive snoring, hypersomnolence, morning headaches, pleuritic, shortness of breath, sputum  productive, and wheezing. GI:  Denies abdominal pain, bloody stools, change in bowel habits, constipation, dark tarry stools, diarrhea, excessive appetite, gas, hemorrhoids, indigestion, loss of appetite, nausea, vomiting, vomiting blood, and yellowish skin color. GU:  Denies abnormal vaginal bleeding, decreased libido, discharge, dysuria, genital sores,  hematuria, incontinence, nocturia, urinary frequency, and urinary hesitancy. MS:  Denies joint pain, joint redness, joint swelling, loss of strength, low back pain, mid back pain, muscle aches, muscle , cramps, muscle weakness, stiffness, and thoracic pain. Derm:  Denies changes in color of skin, changes in nail beds, dryness, excessive perspiration, flushing, hair loss, insect bite(s), itching, lesion(s), poor wound healing, and rash. Neuro:  Denies brief paralysis, difficulty with concentration, disturbances in coordination, falling down, headaches, inability to speak, memory loss, numbness, poor balance, seizures, sensation of room spinning, tingling, tremors, visual disturbances, and weakness. Psych:  Denies alternate hallucination ( auditory/visual), anxiety, depression, easily angered, easily tearful, irritability, mental problems, panic attacks, sense of great danger, suicidal thoughts/plans, thoughts of violence, unusual visions or sounds, and thoughts /plans of harming others. Endo:  Denies cold intolerance, excessive hunger, excessive thirst, excessive urination, heat intolerance, polyuria, and weight change. Heme:  Denies abnormal bruising, bleeding, enlarge lymph nodes, fevers, pallor, and skin discoloration. Allergy:  Denies hives or rash, itching eyes, persistent infections, seasonal allergies, and sneezing.  Physical Exam  General:  Well-developed,well-nourished,in no acute distress; alert,appropriate and cooperative throughout examination Head:  Normocephalic and atraumatic without obvious abnormalities. No apparent alopecia or balding. Eyes:  vision grossly intact, pupils equal, pupils round, pupils reactive to light, and no injection.   Ears:  External ear exam shows no significant lesions or deformities.  Otoscopic examination reveals clear canals, tympanic membranes are intact bilaterally without bulging, retraction, inflammation or discharge. Hearing is grossly normal  bilaterally. Nose:  External nasal examination shows no deformity or inflammation. Nasal mucosa are pink and moist without lesions or exudates. Mouth:  Oral mucosa and oropharynx without lesions or exudates.  Teeth in good repair. Neck:  No deformities, masses, or tenderness noted. Breasts:  No mass, nodules, thickening, tenderness, bulging, retraction, inflamation, nipple discharge or skin changes noted.   Lungs:  Normal respiratory effort, chest expands symmetrically. Lungs are clear to auscultation, no crackles or wheezes. Heart:  normal rate and no murmur.   Abdomen:  Bowel sounds positive,abdomen soft and non-tender without masses, organomegaly or hernias noted. Msk:  normal ROM, no joint tenderness, no joint swelling, no joint warmth, no redness over joints, no joint deformities, no joint instability, and no crepitation.   Pulses:  R and L carotid,radial,femoral,dorsalis pedis and posterior tibial pulses are full and equal bilaterally Extremities:  No clubbing, cyanosis, edema, or deformity noted with normal full range of motion of all joints.   Neurologic:  No cranial nerve deficits noted. Station and gait are normal. Plantar reflexes are down-going bilaterally. DTRs are symmetrical throughout. Sensory, motor and coordinative functions appear intact. Skin:  Intact without suspicious lesions or rashes Cervical Nodes:  No lymphadenopathy noted Psych:  Cognition and judgment appear intact. Alert and cooperative with normal attention span and concentration. No apparent delusions, illusions, hallucinations   Impression & Recommendations:  Problem # 1:  PREVENTIVE HEALTH CARE (ICD-V70.0)  Orders: Venipuncture (11914) TLB-B12 + Folate Pnl (78295_62130-Q65/HQI) TLB-Lipid Panel (80061-LIPID) TLB-BMP (Basic Metabolic Panel-BMET) (80048-METABOL) TLB-CBC Platelet - w/Differential (85025-CBCD) TLB-Hepatic/Liver Function Pnl (80076-HEPATIC) TLB-TSH (Thyroid Stimulating Hormone)  (84443-TSH) T-T4, Free (69629-52841) T- * Misc. Laboratory test [phone]) EKG w/ Interpretation (93000)  Problem # 2:  PALPITATIONS,  RECURRENT (ICD-785.1)  Orders: Venipuncture (09811) TLB-B12 + Folate Pnl ([id number]_29562-Z30/QMV) TLB-Lipid Panel (80061-LIPID) TLB-BMP (Basic Metabolic Panel-BMET) (80048-METABOL) TLB-CBC Platelet - w/Differential (85025-CBCD) TLB-Hepatic/Liver Function Pnl (80076-HEPATIC) TLB-TSH (Thyroid Stimulating Hormone) (84443-TSH) T-T4, Free (78469-62952) T- * Misc. Laboratory test [phone]) T-2 View CXR [phone]) Cardiology Referral (Cardiology) Cardiology Referral (Cardiology)  Her updated medication list for this problem includes:    Atenolol 50 Mg Tabs (Atenolol) .Marland Kitchen... 1 by mouth once daily  Problem # 3:  COUGH (ICD-786.2)  Orders: T-2 View CXR (71020TC)  Problem # 4:  UNSPECIFIED VITAMIN D DEFICIENCY (ICD-268.9)  Orders: Venipuncture (25366) TLB-B12 + Folate Pnl (44034_74259-D63/OVF) TLB-Lipid Panel (80061-LIPID) TLB-BMP (Basic Metabolic Panel-BMET) (80048-METABOL) TLB-CBC Platelet - w/Differential (85025-CBCD) TLB-Hepatic/Liver Function Pnl (80076-HEPATIC) TLB-TSH (Thyroid Stimulating Hormone) (84443-TSH) T-T4, Free (64332-95188) T- * Misc. Laboratory test [phone])  Problem # 5:  HYPERTENSION (ICD-401.9)  Her updated medication list for this problem includes:    Cardizem Cd 240 Mg Cp24 (Diltiazem hcl coated beads) .Marland Kitchen... Take 1 capsule by mouth once a day    Atenolol 50 Mg Tabs (Atenolol) .Marland Kitchen... 1 by mouth once daily    Hydrochlorothiazide 25 Mg Tabs (Hydrochlorothiazide) .Marland Kitchen... 1/2 tab by mouth once daily  Orders: Venipuncture (63016) TLB-B12 + Folate Pnl ([id number]-D22/GUR) TLB-Lipid Panel (80061-LIPID) TLB-BMP (Basic Metabolic Panel-BMET) (80048-METABOL) TLB-CBC Platelet - w/Differential (85025-CBCD) TLB-Hepatic/Liver Function Pnl (80076-HEPATIC) TLB-TSH (Thyroid Stimulating Hormone) (84443-TSH) T-T4, Free (42706-23762) T- * Misc.  Laboratory test [phone])  Problem # 6:  HYPERLIPIDEMIA (ICD-272.4)  Her updated medication list for this problem includes:    Fenofibrate 160 Mg Tabs (Fenofibrate) .Marland Kitchen... 1 by mouth once daily    Pravachol 20 Mg Tabs (Pravastatin sodium) .Marland Kitchen... Take one tablet at bedtime  Orders: Venipuncture (76160) TLB-B12 + Folate Pnl (73710_62694-W54/OEV) TLB-Lipid Panel (80061-LIPID) TLB-BMP (Basic Metabolic Panel-BMET) (80048-METABOL) TLB-CBC Platelet - w/Differential (85025-CBCD) TLB-Hepatic/Liver Function Pnl (80076-HEPATIC) TLB-TSH (Thyroid Stimulating Hormone) (84443-TSH) T-T4, Free (03500-93818) T- * Misc. Laboratory test ([phone])  Complete Medication List: 1)  Cardizem Cd 240 Mg Cp24 (Diltiazem hcl coated beads) .... Take 1 capsule by mouth once a day 2)  Atenolol 50 Mg Tabs (Atenolol) .Marland Kitchen.. 1 by mouth once daily 3)  Hydrochlorothiazide 25 Mg Tabs (Hydrochlorothiazide) .... 1/2 tab by mouth once daily 4)  Fenofibrate 160 Mg Tabs (Fenofibrate) .Marland Kitchen.. 1 by mouth once daily 5)  Vitamin D3 1000 Unit Caps (Cholecalciferol) .... By mouth once daily 6)  Pravachol 20 Mg Tabs (Pravastatin sodium) .... Take one tablet at bedtime 7)  Aspir-low 81 Mg Tbec (Aspirin) .Marland Kitchen.. 1 by mouth daily. 8)  Fish Oil 1000 Mg Caps (Omega-3 fatty acids) .... By mouth once daily   Immunization History:  Influenza Immunization History:    Influenza:  Fluvax 3+ (10/18/2009)     Appended Document: CPX,FASTING,NO PAP,UMR/RH.....   Appended Document: CPX,FASTING,NO PAP,UMR/RH.....  Laboratory Results   Urine Tests   Date/Time Reported: October 20, 2009 4:52 PM   Routine Urinalysis   Color: yellow Appearance: Clear Glucose: negative   (Normal Range: Negative) Bilirubin: negative   (Normal Range: Negative) Ketone: negative   (Normal Range: Negative) Spec. Gravity: <1.005   (Normal Range: 1.003-1.035) Blood: negative   (Normal Range: Negative) pH: 7.5   (Normal Range: 5.0-8.0) Protein: negative   (Normal  Range: Negative) Urobilinogen: negative   (Normal Range: 0-1) Nitrite: negative   (Normal Range: Negative) Leukocyte Esterace: negative   (Normal Range: Negative)    Comments: Floydene Flock  October 20, 2009 4:52 PM

## 2010-02-09 NOTE — Progress Notes (Signed)
Summary: event monitor  Phone Note Outgoing Call Call back at [facility] Phone [phone]   Call placed by: Stanton Kidney, EMT-P,  October 19, 2009 11:01 AM Summary of Call: Left message for Patient to call back to schedule 21 day event monitor.; Stanton Kidney, EMT-P  October 19, 2009 11:01 AM  pT SCHEDULED FOR 10/21/09 AT 3:30P. Stanton Kidney, EMT-P  October 19, 2009 11:14 AM

## 2010-02-09 NOTE — Progress Notes (Signed)
Summary: pt cld back lab schld [id number]  Phone Note Outgoing Call   Call placed by: Army Fossa CMA,  May 30, 2009 8:56 AM Reason for Call: Confirm/change Appt Summary of Call: Pt needs labwork scheduled:  790.6  272.4  lipid, hep, bmp, [id number]  Follow-up for Phone Call        [email or web address] Barban  May 30, 2009 2:01 PM  Additional Follow-up for Phone Call Additional follow up Details #1::        pt retd call lab scheduled [id number] Additional Follow-up by: Okey Regal [place],  May 31, 2009 2:18 PM

## 2010-02-09 NOTE — Progress Notes (Signed)
Summary: test results  Phone Note Call from Patient Call back at Work Phone [phone] Call back at ext [phone]   Caller: Patient Summary of Call: Pt calling back regarding test results Initial call taken by: Judie Grieve,  November 28, 2009 11:46 AM  Follow-up for Phone Call        Called patient with results of carotid ultrasound. She mentioned that she turned in event monitor on 11/4 ...advised I will follow up and call her back. LM with Marcos Eke to look for final report.  Layne Benton, RN, BSN  November 28, 2009 12:26 PM   Additional Follow-up for Phone Call Additional follow up Details #1::        Called patient with results of event monitor which showed sinus rhythm without arrythmias. Layne Benton, RN, BSN  November 28, 2009 2:41 PM

## 2010-02-09 NOTE — Progress Notes (Signed)
Summary: test result  Phone Note Call from Patient Call back at Home Phone [phone] Call back at Work Phone [phone] Call back at ext [phone] - till 5 :30 pm   Caller: Patient - Reason for Call: Talk to Nurse, Lab or Test Results Details for Reason: holter,echo, cartoid.  Initial call taken by: Lorne Skeens,  November 22, 2009 9:16 AM  Follow-up for Phone Call        Pt. aware of her echo results. Advised her Annice Pih would notify her when her carotid duplex & holter results are available. Whitney Maeola Sarah RN  November 22, 2009 9:35 AM  Follow-up by: Whitney Maeola Sarah RN,  November 22, 2009 9:34 AM

## 2010-02-09 NOTE — Miscellaneous (Signed)
  Clinical Lists Changes  Orders: Added new Referral order of Radiology Referral (Radiology) - Signed 

## 2010-02-09 NOTE — Miscellaneous (Signed)
Summary: Flu/[facility]  Flu/[place]   Imported By: Lanelle Bal 10/26/2009 16:16:05  _____________________________________________________________________  External Attachment:    Type:   Image     Comment:   External Document  Appended Document: Flu/[place]    Clinical Lists Changes

## 2010-02-13 ENCOUNTER — Telehealth: Payer: Self-pay | Admitting: Family Medicine

## 2010-02-17 ENCOUNTER — Encounter: Payer: Self-pay | Admitting: Family Medicine

## 2010-02-17 ENCOUNTER — Ambulatory Visit (INDEPENDENT_AMBULATORY_CARE_PROVIDER_SITE_OTHER): Payer: Commercial Managed Care - PPO | Admitting: Family Medicine

## 2010-02-17 DIAGNOSIS — J209 Acute bronchitis, unspecified: Secondary | ICD-10-CM

## 2010-02-23 NOTE — Assessment & Plan Note (Signed)
Summary: sinus congestion, cough, sore throat//fd   Vital Signs:  Patient profile:   60 year old female Menstrual status:  hysterectomy Weight:      187.13 pounds Temp:     98.3 degrees F oral Pulse rate:   76 / minute Pulse rhythm:   regular BP sitting:   112 / 70  (left arm) Cuff size:   large  Vitals Entered By: Army Fossa CMA (February 17, 2010 3:43 PM) CC: Head congestion, Chest congestion Startede Sunday, URI symptoms Comments [place] outpatient    History of Present Illness:       This is a 60 year old woman who presents with URI symptoms.  The symptoms began 6 days ago.  The patient complains of nasal congestion, purulent nasal discharge, productive cough, earache, and sick contacts, but denies clear nasal discharge, sore throat, and dry cough.  The patient denies fever, low-grade fever (<100.5 degrees), fever of 100.5-103 degrees, fever of 103.1-104 degrees, fever to >104 degrees, stiff neck, dyspnea, wheezing, rash, vomiting, diarrhea, use of an antipyretic, and response to antipyretic.  The patient also reports headache.  The patient denies the following risk factors for Strep sinusitis: unilateral facial pain, unilateral nasal discharge, poor response to decongestant, double sickening, tooth pain, Strep exposure, tender adenopathy, and absence of cough.    Preventive Screening-Counseling & Management  Alcohol-Tobacco     Smoking Status: never  Caffeine-Diet-Exercise     Caffeine use/day: 0     Does Patient Exercise: yes  Hep-HIV-STD-Contraception     Dental Visit-last 6 months yes  Safety-Violence-Falls     Seat Belt Use: yes      Sexual History:  currently monogamous.        Drug Use:  no.    Current Medications (verified): 1)  Cardizem Cd 240 Mg Cp24 (Diltiazem Hcl Coated Beads) .... Take 1 Capsule By Mouth Once A Day 2)  Atenolol 50 Mg  Tabs (Atenolol) .Marland Kitchen.. 1 By Mouth Once Daily 3)  Hydrochlorothiazide 25 Mg  Tabs (Hydrochlorothiazide) .... 1/2 Tab  By Mouth Once Daily 4)  Fenofibrate 160 Mg  Tabs (Fenofibrate) .Marland Kitchen.. 1 By Mouth Once Daily 5)  Vitamin D3 1000 Unit Caps (Cholecalciferol) .... By Mouth Once Daily 6)  Pravachol 20 Mg Tabs (Pravastatin Sodium) .... Take One Tablet At Bedtime 7)  Aspir-Low 81 Mg Tbec (Aspirin) .Marland Kitchen.. 1 By Mouth Daily. 8)  Fish Oil   Oil (Fish Oil) .... Once Daily 9)  Multivitamins   Tabs (Multiple Vitamin) .... Once Daily 10)  Ceftin 500 Mg Tabs (Cefuroxime Axetil) .Marland Kitchen.. 1 By Mouth Two Times A Day 11)  Cheratussin Ac 100-10 Mg/48ml Syrp (Guaifenesin-Codeine) .Marland Kitchen.. 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) MYALGIA (ICD-729.1) 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) FASTING HYPERGLYCEMIA (ICD-790.29) HYPERTENSION (ICD-401.9) HYPERLIPIDEMIA (ICD-272.4)  Family History: Last updated: 10/19/2009 Family History Diabetes 1st degree relative Family History High cholesterol Family History Hypertension Family History of Arthritis Family History of CAD Female 1st degree relative 61 yo MGF-- prostate ca Family History of Alcoholism/Addiction Family History Depression Mother passed away May 2011--77yo--dementia, alz,  GI bleed, ?stroke  Social History: Last updated:  11/11/2008 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 Family History Diabetes 1st degree relative Family History High cholesterol Family History Hypertension Family History of Arthritis Family History of CAD Female 1st degree relative 61 yo MGF-- prostate ca Family History of Alcoholism/Addiction Family History Depression Mother passed away May 2011--77yo--dementia, alz,  GI bleed, ?stroke  Social History: Reviewed history from 11/11/2008 and no changes required. 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  Otoscopic examination reveals clear canals, tympanic membranes are intact bilaterally without bulging, retraction, inflammation or discharge. Hearing is grossly normal bilaterally. Nose:  L frontal sinus tenderness, L maxillary sinus tenderness, R frontal sinus tenderness, and R maxillary sinus tenderness.   Mouth:  Oral mucosa and oropharynx without lesions or exudates.  Teeth in good repair. Neck:  No deformities, masses, or tenderness noted. Lungs:  R wheezes and L wheezes.   Heart:  Normal rate and regular rhythm. S1 and S2 normal without gallop, murmur, click, rub or other extra sounds. Extremities:  No clubbing, cyanosis, edema, or deformity noted with normal full range of motion of all joints.   Cervical Nodes:  No lymphadenopathy noted Psych:  Cognition and judgment appear intact. Alert and cooperative with normal attention span and concentration. No apparent delusions, illusions, hallucinations   Impression & Recommendations:  Problem # 1:  BRONCHITIS- ACUTE (ICD-466.0)  Her  updated medication list for this problem includes:    Ceftin 500 Mg Tabs (Cefuroxime axetil) .Marland Kitchen... 1 by mouth two times a day    Cheratussin Ac 100-10 Mg/39ml Syrp (Guaifenesin-codeine) .Marland Kitchen... 1-2 tsp by mouth at bedtime as needed cough  Take antibiotics and other medications as directed. Encouraged to push clear liquids, get enough rest, and take acetaminophen as needed. To be seen in 5-7 days if no improvement, sooner if worse.  Complete Medication List: 1)  Cardizem Cd 240 Mg Cp24 (Diltiazem hcl coated beads) .... Take 1 capsule by mouth once a day 2)  Atenolol 50 Mg Tabs (Atenolol) .Marland Kitchen.. 1 by mouth once daily 3)  Hydrochlorothiazide 25 Mg Tabs (Hydrochlorothiazide) .... 1/2 tab by mouth once daily 4)  Fenofibrate 160 Mg Tabs (Fenofibrate) .Marland Kitchen.. 1 by mouth once daily 5)  Vitamin D3 1000 Unit Caps (Cholecalciferol) .... By mouth once daily 6)  Pravachol 20 Mg Tabs (Pravastatin sodium) .... Take one tablet at bedtime 7)  Aspir-low 81 Mg Tbec (Aspirin) .Marland Kitchen.. 1 by mouth daily. 8)  Fish Oil Oil (Fish oil) .... Once daily 9)  Multivitamins Tabs (Multiple vitamin) .... Once daily 10)  Ceftin 500 Mg Tabs (Cefuroxime axetil) .Marland Kitchen.. 1 by mouth two times a day 11)  Cheratussin Ac 100-10 Mg/65ml Syrp (Guaifenesin-codeine) .Marland Kitchen.. 1-2 tsp by mouth at bedtime as needed cough Prescriptions: CHERATUSSIN AC 100-10 MG/5ML SYRP (GUAIFENESIN-CODEINE) 1-2 tsp by mouth at bedtime as needed cough  #6 oz x 0   Entered and Authorized by:   Loreen Freud DO   Signed by:   Loreen Freud DO on 02/17/2010   Method used:   Print then Give to  Patient   RxID:   [id number] CEFTIN 500 MG TABS (CEFUROXIME AXETIL) 1 by mouth two times a day  #20 x 0   Entered and Authorized by:   Loreen Freud DO   Signed by:   Loreen Freud DO on 02/17/2010   Method used:   Electronically to        [facility]* (retail)       [address]       [address] Shipping/mailing       [place], [place]  [address]       Ph:  [phone]       Fax: [phone]   RxID:   [id number]    Orders Added: 1)  Est. Patient Level III [20254]

## 2010-02-23 NOTE — Progress Notes (Signed)
Summary: monitor results  Phone Note Call from Patient   Summary of Call: Patient would like a copy of her 21 day monitor results. She was advised that a copy of this was sent to Dr. Laury Axon and possibly has not been sent to scanning. Please advise. Initial call taken by: Lucious Groves CMA,  February 13, 2010 1:33 PM  Follow-up for Phone Call        i didnot receive event monitor but cardiology called her in Nov to let her know it was normal.  --- cardiology has the results --- because we referred her to them they have the actual hard copy---she can call them for a copy. Follow-up by: Loreen Freud DO,  February 13, 2010 1:40 PM  Additional Follow-up for Phone Call Additional follow up Details #1::        I spoke with patient and advised we did not do the testa nd she will need to call Cardiology for the results because they are not on the system. sHe voiced understanding Additional Follow-up by: Almeta Monas CMA Duncan Dull),  February 13, 2010 1:56 PM

## 2010-05-15 ENCOUNTER — Other Ambulatory Visit: Payer: Self-pay

## 2010-05-15 MED ORDER — PRAVASTATIN SODIUM 20 MG PO TABS: 20.0000 mg | ORAL_TABLET | Freq: Every evening | ORAL | Status: DC

## 2010-05-15 NOTE — Telephone Encounter (Signed)
Letter mailed---- Labs due 272.4   [facility] with ov 2 weeks after    KP

## 2010-05-26 NOTE — Assessment & Plan Note (Signed)
[facility]                                 ON-CALL NOTE   Sarah Durham, Sarah Durham                       MRN:          [id number]  DATE:04/21/2006                            DOB:          05-Sep-1950    [phone], patient of Dr. Laury Axon.   The patient is calling because she has trouble taking her medications.  She said she was seen in the office with a viral type illness.  Nose  swab did not reveal influenza.  She has a severe sore throat that is  getting worse.  She has been on Augmentin, but it has not helped.  She  is not sure if it is an evolution of the underlying illness or a drug  reaction.  She is otherwise well.   Advised her to stop the Augmentin for now, call Dr. Laury Axon on Monday for  reevaluation.     Jeffrey A. Tawanna Cooler, MD  Electronically Signed    JAT/MedQ  DD: 04/21/2006  DT: 04/21/2006  Job #: [id number]

## 2010-07-01 ENCOUNTER — Encounter: Payer: Self-pay | Admitting: Family Medicine

## 2010-07-06 ENCOUNTER — Encounter: Payer: Commercial Managed Care - PPO | Admitting: Family Medicine

## 2011-11-23 ENCOUNTER — Encounter: Payer: Self-pay | Admitting: Family Medicine

## 2011-11-23 ENCOUNTER — Ambulatory Visit (INDEPENDENT_AMBULATORY_CARE_PROVIDER_SITE_OTHER): Payer: 59 | Admitting: Family Medicine

## 2011-11-23 ENCOUNTER — Telehealth: Payer: Self-pay

## 2011-11-23 VITALS — BP 114/64 | HR 61 | Temp 98.9°F | Ht 60.75 in | Wt 180.0 lb

## 2011-11-23 DIAGNOSIS — Z780 Asymptomatic menopausal state: Secondary | ICD-10-CM

## 2011-11-23 DIAGNOSIS — E781 Pure hyperglyceridemia: Secondary | ICD-10-CM

## 2011-11-23 DIAGNOSIS — Z1231 Encounter for screening mammogram for malignant neoplasm of breast: Secondary | ICD-10-CM

## 2011-11-23 DIAGNOSIS — I10 Essential (primary) hypertension: Secondary | ICD-10-CM

## 2011-11-23 DIAGNOSIS — N390 Urinary tract infection, site not specified: Secondary | ICD-10-CM

## 2011-11-23 DIAGNOSIS — E785 Hyperlipidemia, unspecified: Secondary | ICD-10-CM

## 2011-11-23 DIAGNOSIS — Z1239 Encounter for other screening for malignant neoplasm of breast: Secondary | ICD-10-CM

## 2011-11-23 DIAGNOSIS — Z0000 Encounter for general adult medical examination without abnormal findings: Secondary | ICD-10-CM

## 2011-11-23 LAB — POCT URINALYSIS DIPSTICK
Bilirubin, UA: NEGATIVE
Glucose, UA: NEGATIVE
Nitrite, UA: NEGATIVE

## 2011-11-23 LAB — LIPID PANEL: Total CHOL/HDL Ratio: 4

## 2011-11-23 LAB — BASIC METABOLIC PANEL
BUN: 17 mg/dL (ref 6–23)
Creatinine, Ser: 0.8 mg/dL (ref 0.4–1.2)
GFR: 81.95 mL/min (ref >60.00)
Potassium: 4.2 mEq/L (ref 3.5–5.1)

## 2011-11-23 LAB — HEPATIC FUNCTION PANEL
ALT: 23 U/L (ref 0–35)
AST: 20 U/L (ref 0–37)
Bilirubin, Direct: 0 mg/dL (ref 0.0–0.3)
Total Bilirubin: 0.3 mg/dL (ref 0.3–1.2)

## 2011-11-23 LAB — CBC WITH DIFFERENTIAL/PLATELET
Eosinophils Relative: 1.3 % (ref 0.0–5.0)
HCT: 41.3 % (ref 36.0–46.0)
Monocytes Relative: 8 % (ref 3.0–12.0)
Neutrophils Relative %: 56.4 % (ref 43.0–77.0)
Platelets: 333 10*3/uL (ref 150.0–400.0)
WBC: 6.8 10*3/uL (ref 4.5–10.5)

## 2011-11-23 MED ORDER — FENOFIBRATE 145 MG PO TABS: 145.0000 mg | ORAL_TABLET | Freq: Every day | ORAL | Status: DC

## 2011-11-23 NOTE — Telephone Encounter (Signed)
Called and advised pharmacy prior auth approved. Pharmacist retried Rx and stated it went through. Rx done.   MW

## 2011-11-23 NOTE — Addendum Note (Signed)
Addended by: Silvio Pate D on: 11/23/2011 03:13 PM   Modules accepted: Orders

## 2011-11-23 NOTE — Patient Instructions (Signed)
Preventive Care for Adults, Female A healthy lifestyle and preventive care can promote health and wellness. Preventive health guidelines for women include the following key practices.  A routine yearly physical is a good way to check with your caregiver about your health and preventive screening. It is a chance to share any concerns and updates on your health, and to receive a thorough exam.  Visit your dentist for a routine exam and preventive care every 6 months. Brush your teeth twice a day and floss once a day. Good oral hygiene prevents tooth decay and gum disease.  The frequency of eye exams is based on your age, health, family medical history, use of contact lenses, and other factors. Follow your caregiver's recommendations for frequency of eye exams.  Eat a healthy diet. Foods like vegetables, fruits, whole grains, low-fat dairy products, and lean protein foods contain the nutrients you need without too many calories. Decrease your intake of foods high in solid fats, added sugars, and salt. Eat the right amount of calories for you.Get information about a proper diet from your caregiver, if necessary.  Regular physical exercise is one of the most important things you can do for your health. Most adults should get at least 150 minutes of moderate-intensity exercise (any activity that increases your heart rate and causes you to sweat) each week. In addition, most adults need muscle-strengthening exercises on 2 or more days a week.  Maintain a healthy weight. The body mass index (BMI) is a screening tool to identify possible weight problems. It provides an estimate of body fat based on height and weight. Your caregiver can help determine your BMI, and can help you achieve or maintain a healthy weight.For adults 20 years and older:  A BMI below 18.5 is considered underweight.  A BMI of 18.5 to 24.9 is normal.  A BMI of 25 to 29.9 is considered overweight.  A BMI of 30 and above is  considered obese.  Maintain normal blood lipids and cholesterol levels by exercising and minimizing your intake of saturated fat. Eat a balanced diet with plenty of fruit and vegetables. Blood tests for lipids and cholesterol should begin at age 20 and be repeated every 5 years. If your lipid or cholesterol levels are high, you are over 50, or you are at high risk for heart disease, you may need your cholesterol levels checked more frequently.Ongoing high lipid and cholesterol levels should be treated with medicines if diet and exercise are not effective.  If you smoke, find out from your caregiver how to quit. If you do not use tobacco, do not start.  If you are pregnant, do not drink alcohol. If you are breastfeeding, be very cautious about drinking alcohol. If you are not pregnant and choose to drink alcohol, do not exceed 1 drink per day. One drink is considered to be 12 ounces (355 mL) of beer, 5 ounces (148 mL) of wine, or 1.5 ounces (44 mL) of liquor.  Avoid use of street drugs. Do not share needles with anyone. Ask for help if you need support or instructions about stopping the use of drugs.  High blood pressure causes heart disease and increases the risk of stroke. Your blood pressure should be checked at least every 1 to 2 years. Ongoing high blood pressure should be treated with medicines if weight loss and exercise are not effective.  If you are 55 to 62 years old, ask your caregiver if you should take aspirin to prevent strokes.  Diabetes   screening involves taking a blood sample to check your fasting blood sugar level. This should be done once every 3 years, after age 45, if you are within normal weight and without risk factors for diabetes. Testing should be considered at a younger age or be carried out more frequently if you are overweight and have at least 1 risk factor for diabetes.  Breast cancer screening is essential preventive care for women. You should practice "breast  self-awareness." This means understanding the normal appearance and feel of your breasts and may include breast self-examination. Any changes detected, no matter how small, should be reported to a caregiver. Women in their 20s and 30s should have a clinical breast exam (CBE) by a caregiver as part of a regular health exam every 1 to 3 years. After age 40, women should have a CBE every year. Starting at age 40, women should consider having a mammography (breast X-ray test) every year. Women who have a family history of breast cancer should talk to their caregiver about genetic screening. Women at a high risk of breast cancer should talk to their caregivers about having magnetic resonance imaging (MRI) and a mammography every year.  The Pap test is a screening test for cervical cancer. A Pap test can show cell changes on the cervix that might become cervical cancer if left untreated. A Pap test is a procedure in which cells are obtained and examined from the lower end of the uterus (cervix).  Women should have a Pap test starting at age 21.  Between ages 21 and 29, Pap tests should be repeated every 2 years.  Beginning at age 30, you should have a Pap test every 3 years as long as the past 3 Pap tests have been normal.  Some women have medical problems that increase the chance of getting cervical cancer. Talk to your caregiver about these problems. It is especially important to talk to your caregiver if a new problem develops soon after your last Pap test. In these cases, your caregiver may recommend more frequent screening and Pap tests.  The above recommendations are the same for women who have or have not gotten the vaccine for human papillomavirus (HPV).  If you had a hysterectomy for a problem that was not cancer or a condition that could lead to cancer, then you no longer need Pap tests. Even if you no longer need a Pap test, a regular exam is a good idea to make sure no other problems are  starting.  If you are between ages 65 and 70, and you have had normal Pap tests going back 10 years, you no longer need Pap tests. Even if you no longer need a Pap test, a regular exam is a good idea to make sure no other problems are starting.  If you have had past treatment for cervical cancer or a condition that could lead to cancer, you need Pap tests and screening for cancer for at least 20 years after your treatment.  If Pap tests have been discontinued, risk factors (such as a new sexual partner) need to be reassessed to determine if screening should be resumed.  The HPV test is an additional test that may be used for cervical cancer screening. The HPV test looks for the virus that can cause the cell changes on the cervix. The cells collected during the Pap test can be tested for HPV. The HPV test could be used to screen women aged 30 years and older, and should   be used in women of any age who have unclear Pap test results. After the age of 30, women should have HPV testing at the same frequency as a Pap test.  Colorectal cancer can be detected and often prevented. Most routine colorectal cancer screening begins at the age of 50 and continues through age 75. However, your caregiver may recommend screening at an earlier age if you have risk factors for colon cancer. On a yearly basis, your caregiver may provide home test kits to check for hidden blood in the stool. Use of a small camera at the end of a tube, to directly examine the colon (sigmoidoscopy or colonoscopy), can detect the earliest forms of colorectal cancer. Talk to your caregiver about this at age 50, when routine screening begins. Direct examination of the colon should be repeated every 5 to 10 years through age 75, unless early forms of pre-cancerous polyps or small growths are found.  Hepatitis C blood testing is recommended for all people born from 1945 through 1965 and any individual with known risks for hepatitis C.  Practice  safe sex. Use condoms and avoid high-risk sexual practices to reduce the spread of sexually transmitted infections (STIs). STIs include gonorrhea, chlamydia, syphilis, trichomonas, herpes, HPV, and human immunodeficiency virus (HIV). Herpes, HIV, and HPV are viral illnesses that have no cure. They can result in disability, cancer, and death. Sexually active women aged 25 and younger should be checked for chlamydia. Older women with new or multiple partners should also be tested for chlamydia. Testing for other STIs is recommended if you are sexually active and at increased risk.  Osteoporosis is a disease in which the bones lose minerals and strength with aging. This can result in serious bone fractures. The risk of osteoporosis can be identified using a bone density scan. Women ages 65 and over and women at risk for fractures or osteoporosis should discuss screening with their caregivers. Ask your caregiver whether you should take a calcium supplement or vitamin D to reduce the rate of osteoporosis.  Menopause can be associated with physical symptoms and risks. Hormone replacement therapy is available to decrease symptoms and risks. You should talk to your caregiver about whether hormone replacement therapy is right for you.  Use sunscreen with sun protection factor (SPF) of 30 or more. Apply sunscreen liberally and repeatedly throughout the day. You should seek shade when your shadow is shorter than you. Protect yourself by wearing long sleeves, pants, a wide-brimmed hat, and sunglasses year round, whenever you are outdoors.  Once a month, do a whole body skin exam, using a mirror to look at the skin on your back. Notify your caregiver of new moles, moles that have irregular borders, moles that are larger than a pencil eraser, or moles that have changed in shape or color.  Stay current with required immunizations.  Influenza. You need a dose every fall (or winter). The composition of the flu vaccine  changes each year, so being vaccinated once is not enough.  Pneumococcal polysaccharide. You need 1 to 2 doses if you smoke cigarettes or if you have certain chronic medical conditions. You need 1 dose at age 65 (or older) if you have never been vaccinated.  Tetanus, diphtheria, pertussis (Tdap, Td). Get 1 dose of Tdap vaccine if you are younger than age 65, are over 65 and have contact with an infant, are a healthcare worker, are pregnant, or simply want to be protected from whooping cough. After that, you need a Td   booster dose every 10 years. Consult your caregiver if you have not had at least 3 tetanus and diphtheria-containing shots sometime in your life or have a deep or dirty wound.  HPV. You need this vaccine if you are a woman age 26 or younger. The vaccine is given in 3 doses over 6 months.  Measles, mumps, rubella (MMR). You need at least 1 dose of MMR if you were born in 1957 or later. You may also need a second dose.  Meningococcal. If you are age 19 to 21 and a first-year college student living in a residence hall, or have one of several medical conditions, you need to get vaccinated against meningococcal disease. You may also need additional booster doses.  Zoster (shingles). If you are age 60 or older, you should get this vaccine.  Varicella (chickenpox). If you have never had chickenpox or you were vaccinated but received only 1 dose, talk to your caregiver to find out if you need this vaccine.  Hepatitis A. You need this vaccine if you have a specific risk factor for hepatitis A virus infection or you simply wish to be protected from this disease. The vaccine is usually given as 2 doses, 6 to 18 months apart.  Hepatitis B. You need this vaccine if you have a specific risk factor for hepatitis B virus infection or you simply wish to be protected from this disease. The vaccine is given in 3 doses, usually over 6 months. Preventive Services / Frequency Ages 19 to 39  Blood  pressure check.** / Every 1 to 2 years.  Lipid and cholesterol check.** / Every 5 years beginning at age 20.  Clinical breast exam.** / Every 3 years for women in their 20s and 30s.  Pap test.** / Every 2 years from ages 21 through 29. Every 3 years starting at age 30 through age 65 or 70 with a history of 3 consecutive normal Pap tests.  HPV screening.** / Every 3 years from ages 30 through ages 65 to 70 with a history of 3 consecutive normal Pap tests.  Hepatitis C blood test.** / For any individual with known risks for hepatitis C.  Skin self-exam. / Monthly.  Influenza immunization.** / Every year.  Pneumococcal polysaccharide immunization.** / 1 to 2 doses if you smoke cigarettes or if you have certain chronic medical conditions.  Tetanus, diphtheria, pertussis (Tdap, Td) immunization. / A one-time dose of Tdap vaccine. After that, you need a Td booster dose every 10 years.  HPV immunization. / 3 doses over 6 months, if you are 26 and younger.  Measles, mumps, rubella (MMR) immunization. / You need at least 1 dose of MMR if you were born in 1957 or later. You may also need a second dose.  Meningococcal immunization. / 1 dose if you are age 19 to 21 and a first-year college student living in a residence hall, or have one of several medical conditions, you need to get vaccinated against meningococcal disease. You may also need additional booster doses.  Varicella immunization.** / Consult your caregiver.  Hepatitis A immunization.** / Consult your caregiver. 2 doses, 6 to 18 months apart.  Hepatitis B immunization.** / Consult your caregiver. 3 doses usually over 6 months. Ages 40 to 64  Blood pressure check.** / Every 1 to 2 years.  Lipid and cholesterol check.** / Every 5 years beginning at age 20.  Clinical breast exam.** / Every year after age 40.  Mammogram.** / Every year beginning at age 40   and continuing for as long as you are in good health. Consult with your  caregiver.  Pap test.** / Every 3 years starting at age 30 through age 65 or 70 with a history of 3 consecutive normal Pap tests.  HPV screening.** / Every 3 years from ages 30 through ages 65 to 70 with a history of 3 consecutive normal Pap tests.  Fecal occult blood test (FOBT) of stool. / Every year beginning at age 50 and continuing until age 75. You may not need to do this test if you get a colonoscopy every 10 years.  Flexible sigmoidoscopy or colonoscopy.** / Every 5 years for a flexible sigmoidoscopy or every 10 years for a colonoscopy beginning at age 50 and continuing until age 75.  Hepatitis C blood test.** / For all people born from 1945 through 1965 and any individual with known risks for hepatitis C.  Skin self-exam. / Monthly.  Influenza immunization.** / Every year.  Pneumococcal polysaccharide immunization.** / 1 to 2 doses if you smoke cigarettes or if you have certain chronic medical conditions.  Tetanus, diphtheria, pertussis (Tdap, Td) immunization.** / A one-time dose of Tdap vaccine. After that, you need a Td booster dose every 10 years.  Measles, mumps, rubella (MMR) immunization. / You need at least 1 dose of MMR if you were born in 1957 or later. You may also need a second dose.  Varicella immunization.** / Consult your caregiver.  Meningococcal immunization.** / Consult your caregiver.  Hepatitis A immunization.** / Consult your caregiver. 2 doses, 6 to 18 months apart.  Hepatitis B immunization.** / Consult your caregiver. 3 doses, usually over 6 months. Ages 65 and over  Blood pressure check.** / Every 1 to 2 years.  Lipid and cholesterol check.** / Every 5 years beginning at age 20.  Clinical breast exam.** / Every year after age 40.  Mammogram.** / Every year beginning at age 40 and continuing for as long as you are in good health. Consult with your caregiver.  Pap test.** / Every 3 years starting at age 30 through age 65 or 70 with a 3  consecutive normal Pap tests. Testing can be stopped between 65 and 70 with 3 consecutive normal Pap tests and no abnormal Pap or HPV tests in the past 10 years.  HPV screening.** / Every 3 years from ages 30 through ages 65 or 70 with a history of 3 consecutive normal Pap tests. Testing can be stopped between 65 and 70 with 3 consecutive normal Pap tests and no abnormal Pap or HPV tests in the past 10 years.  Fecal occult blood test (FOBT) of stool. / Every year beginning at age 50 and continuing until age 75. You may not need to do this test if you get a colonoscopy every 10 years.  Flexible sigmoidoscopy or colonoscopy.** / Every 5 years for a flexible sigmoidoscopy or every 10 years for a colonoscopy beginning at age 50 and continuing until age 75.  Hepatitis C blood test.** / For all people born from 1945 through 1965 and any individual with known risks for hepatitis C.  Osteoporosis screening.** / A one-time screening for women ages 65 and over and women at risk for fractures or osteoporosis.  Skin self-exam. / Monthly.  Influenza immunization.** / Every year.  Pneumococcal polysaccharide immunization.** / 1 dose at age 65 (or older) if you have never been vaccinated.  Tetanus, diphtheria, pertussis (Tdap, Td) immunization. / A one-time dose of Tdap vaccine if you are over   65 and have contact with an infant, are a healthcare worker, or simply want to be protected from whooping cough. After that, you need a Td booster dose every 10 years.  Varicella immunization.** / Consult your caregiver.  Meningococcal immunization.** / Consult your caregiver.  Hepatitis A immunization.** / Consult your caregiver. 2 doses, 6 to 18 months apart.  Hepatitis B immunization.** / Check with your caregiver. 3 doses, usually over 6 months. ** Family history and personal history of risk and conditions may change your caregiver's recommendations. Document Released: 02/20/2001 Document Revised: 03/19/2011  Document Reviewed: 05/22/2010 [employer] Patient Information 2013 [employer].  

## 2011-11-23 NOTE — Assessment & Plan Note (Signed)
Stable con't meds 

## 2011-11-23 NOTE — Assessment & Plan Note (Signed)
Check labs 

## 2011-11-23 NOTE — Telephone Encounter (Signed)
Initiated prior [place] and approved awaiting approval fax.    Tricor 145 mg take 1 tab po qd    MW

## 2011-11-23 NOTE — Progress Notes (Signed)
Subjective:     Sarah Durham is a 61 y.o. female and is here for a comprehensive physical exam. The patient reports no problems.  History   Social History  . Marital Status: Married    Spouse Name: N/A    Number of Children: N/A  . Years of Education: N/A   Occupational History  .  [employer]   Social History Main Topics  . Smoking status: Never Smoker   . Smokeless tobacco: Never Used  . Alcohol Use: No  . Drug Use: No  . Sexually Active: Yes -- Female partner(s)   Other Topics Concern  . Not on file   Social History Narrative   Exercise --wellness coach ,  Walking 2 days a week   Health Maintenance  Topic Date Due  . Colonoscopy  11/12/1999  . Zostavax  11/11/2009  . Influenza Vaccine  09/09/2011  . Mammogram  10/28/2011  . Pap Smear  11/12/2011  . Tetanus/tdap  09/21/2018    The following portions of the patient's history were reviewed and updated as appropriate:  She  has a past medical history of Hyperlipidemia; Hypertension; Hyperglycemia; Visual field defect; Myalgia; Meckel's diverticulum; and Branch retinal vein occlusion. She  does not have any pertinent problems on file. She  has past surgical history that includes Abdominal hysterectomy; Breast lumpectomy (1981); and Tonsillectomy (1955). Her family history includes Alcohol abuse in an unspecified family member; Alzheimer's disease in her mother; Arthritis in an unspecified family member; Coronary artery disease in an unspecified family member; Dementia in her father and mother; Depression in an unspecified family member; Diabetes in an unspecified family member; Hyperlipidemia in an unspecified family member; Hypertension in an unspecified family member; and Prostate cancer in her maternal grandfather. She  reports that she has never smoked. She has never used smokeless tobacco. She reports that she does not drink alcohol or use illicit drugs. She has a current medication list which includes the following  prescription(s): aspirin, atenolol, vitamin d3, diltiazem, fish oil-omega-3 fatty acids, hydrochlorothiazide, multivitamin, and fenofibrate. Current Outpatient Prescriptions on File Prior to Visit  Medication Sig Dispense Refill  . aspirin 81 MG tablet Take 81 mg by mouth daily.        Marland Kitchen atenolol (TENORMIN) 50 MG tablet Take 50 mg by mouth daily.        . Cholecalciferol (VITAMIN D3) 1000 UNITS CAPS Take 2 capsules by mouth.       . diltiazem (CARDIZEM CD) 240 MG 24 hr capsule Take 240 mg by mouth daily. 1/2 in the morning and 1/2 at night      . fish oil-omega-3 fatty acids 1000 MG capsule Take 1 g by mouth daily.        . hydrochlorothiazide 25 MG tablet Take 12.5 mg by mouth daily.       . Multiple Vitamin (MULTIVITAMIN) tablet Take 1 tablet by mouth daily.         She  has no known allergies..  Review of Systems Review of Systems  Constitutional: Negative for activity change, appetite change and fatigue.  HENT: Negative for hearing loss, congestion, tinnitus and ear discharge.  dentist q32m Eyes: Negative for visual disturbance (see optho q1y -- vision corrected to 20/20 with glasses).  Respiratory: Negative for cough, chest tightness and shortness of breath.   Cardiovascular: Negative for chest pain, palpitations and leg swelling.  Gastrointestinal: Negative for abdominal pain, diarrhea, constipation and abdominal distention.  Genitourinary: Negative for urgency, frequency, decreased urine volume  and difficulty urinating.  Musculoskeletal: Negative for back pain, arthralgias and gait problem.  Skin: Negative for color change, pallor and rash.  Neurological: Negative for dizziness, light-headedness, numbness and headaches.  Hematological: Negative for adenopathy. Does not bruise/bleed easily.  Psychiatric/Behavioral: Negative for suicidal ideas, confusion, sleep disturbance, self-injury, dysphoric mood, decreased concentration and agitation.       Objective:    BP 114/64  Pulse  61  Temp 98.9 F (37.2 C) (Oral)  Ht 5' 0.75" (1.543 m)  Wt 180 lb (81.647 kg)  BMI 34.29 kg/m2  SpO2 96% General appearance: alert, cooperative, appears stated age and no distress Head: Normocephalic, without obvious abnormality, atraumatic Eyes: conjunctivae/corneas clear. PERRL, EOM's intact. Fundi benign. Ears: normal TM's and external ear canals both ears Nose: Nares normal. Septum midline. Mucosa normal. No drainage or sinus tenderness. Throat: lips, mucosa, and tongue normal; teeth and gums normal Neck: no adenopathy, no carotid bruit, no JVD, supple, symmetrical, trachea midline and thyroid not enlarged, symmetric, no tenderness/mass/nodules Back: symmetric, no curvature. ROM normal. No CVA tenderness. Lungs: clear to auscultation bilaterally Breasts: normal appearance, no masses or tenderness Heart: regular rate and rhythm, S1, S2 normal, no murmur, click, rub or gallop Abdomen: soft, non-tender; bowel sounds normal; no masses,  no organomegaly Pelvic: not indicated; status post hysterectomy, negative ROS Extremities: extremities normal, atraumatic, no cyanosis or edema Pulses: 2+ and symmetric Skin: Skin color, texture, turgor normal. No rashes or lesions Lymph nodes: Cervical, supraclavicular, and axillary nodes normal. Neurologic: Alert and oriented X 3, normal strength and tone. Normal symmetric reflexes. Normal coordination and gait psych-- no depression    Assessment:    Healthy female exam.      Plan:    ghm utd Check labs See After Visit Summary for Counseling Recommendations

## 2011-11-25 LAB — URINE CULTURE: Colony Count: 6000

## 2011-11-29 ENCOUNTER — Encounter: Payer: Self-pay | Admitting: Internal Medicine

## 2011-12-25 ENCOUNTER — Ambulatory Visit (HOSPITAL_COMMUNITY)
Admission: RE | Admit: 2011-12-25 | Discharge: 2011-12-25 | Disposition: A | Discharge status: Home / Self Care | Payer: 59 | Source: Ambulatory Visit | Attending: Family Medicine | Admitting: Family Medicine

## 2011-12-25 ENCOUNTER — Ambulatory Visit (INDEPENDENT_AMBULATORY_CARE_PROVIDER_SITE_OTHER): Payer: 59

## 2011-12-25 DIAGNOSIS — Z1231 Encounter for screening mammogram for malignant neoplasm of breast: Secondary | ICD-10-CM

## 2011-12-25 DIAGNOSIS — Z2911 Encounter for prophylactic immunotherapy for respiratory syncytial virus (RSV): Secondary | ICD-10-CM

## 2011-12-25 DIAGNOSIS — Z1239 Encounter for other screening for malignant neoplasm of breast: Secondary | ICD-10-CM

## 2011-12-25 DIAGNOSIS — Z780 Asymptomatic menopausal state: Secondary | ICD-10-CM | POA: Insufficient documentation

## 2011-12-25 DIAGNOSIS — Z13820 Encounter for screening for osteoporosis: Secondary | ICD-10-CM | POA: Insufficient documentation

## 2011-12-25 DIAGNOSIS — Z23 Encounter for immunization: Secondary | ICD-10-CM

## 2012-01-21 ENCOUNTER — Encounter: Payer: 59 | Admitting: Internal Medicine

## 2012-01-30 ENCOUNTER — Telehealth: Payer: Self-pay

## 2012-01-30 NOTE — Telephone Encounter (Signed)
Reviewed Bone density with the patient and she is Osteopenic. It is recommended by Dr.Lowne that she takes 1200-1500 mg of calcium daily and Vitamin D 1000 units and if doing so offer Fosamax 70 mg weekly #4 with 11 refills. Discussed with patient and she stated she is not taking the calcium she will start taking calcium daily 1200-1500 daily along with the D 2000 units daily and will recheck in 2 years.         KP

## 2012-04-14 ENCOUNTER — Encounter: Payer: Self-pay | Admitting: Internal Medicine

## 2012-05-19 ENCOUNTER — Ambulatory Visit (INDEPENDENT_AMBULATORY_CARE_PROVIDER_SITE_OTHER): Payer: 59 | Admitting: Family Medicine

## 2012-05-19 ENCOUNTER — Encounter: Payer: Self-pay | Admitting: Family Medicine

## 2012-05-19 VITALS — BP 114/62 | HR 61 | Temp 98.3°F | Ht 61.0 in | Wt 184.6 lb

## 2012-05-19 DIAGNOSIS — Z0000 Encounter for general adult medical examination without abnormal findings: Secondary | ICD-10-CM

## 2012-05-19 DIAGNOSIS — I10 Essential (primary) hypertension: Secondary | ICD-10-CM

## 2012-05-19 DIAGNOSIS — E785 Hyperlipidemia, unspecified: Secondary | ICD-10-CM

## 2012-05-19 DIAGNOSIS — R002 Palpitations: Secondary | ICD-10-CM

## 2012-05-19 MED ORDER — VERAPAMIL HCL 120 MG PO TABS: 120.0000 mg | ORAL_TABLET | Freq: Three times a day (TID) | ORAL | Status: DC

## 2012-05-19 MED ORDER — LOVASTATIN 40 MG PO TABS: 40.0000 mg | ORAL_TABLET | Freq: Every day | ORAL | Status: AC

## 2012-05-19 NOTE — Assessment & Plan Note (Signed)
Check labs 

## 2012-05-19 NOTE — Patient Instructions (Addendum)
Preventive Care for Adults, Female A healthy lifestyle and preventive care can promote health and wellness. Preventive health guidelines for women include the following key practices.  A routine yearly physical is a good way to check with your caregiver about your health and preventive screening. It is a chance to share any concerns and updates on your health, and to receive a thorough exam.  Visit your dentist for a routine exam and preventive care every 6 months. Brush your teeth twice a day and floss once a day. Good oral hygiene prevents tooth decay and gum disease.  The frequency of eye exams is based on your age, health, family medical history, use of contact lenses, and other factors. Follow your caregiver's recommendations for frequency of eye exams.  Eat a healthy diet. Foods like vegetables, fruits, whole grains, low-fat dairy products, and lean protein foods contain the nutrients you need without too many calories. Decrease your intake of foods high in solid fats, added sugars, and salt. Eat the right amount of calories for you.Get information about a proper diet from your caregiver, if necessary.  Regular physical exercise is one of the most important things you can do for your health. Most adults should get at least 150 minutes of moderate-intensity exercise (any activity that increases your heart rate and causes you to sweat) each week. In addition, most adults need muscle-strengthening exercises on 2 or more days a week.  Maintain a healthy weight. The body mass index (BMI) is a screening tool to identify possible weight problems. It provides an estimate of body fat based on height and weight. Your caregiver can help determine your BMI, and can help you achieve or maintain a healthy weight.For adults 20 years and older:  A BMI below 18.5 is considered underweight.  A BMI of 18.5 to 24.9 is normal.  A BMI of 25 to 29.9 is considered overweight.  A BMI of 30 and above is  considered obese.  Maintain normal blood lipids and cholesterol levels by exercising and minimizing your intake of saturated fat. Eat a balanced diet with plenty of fruit and vegetables. Blood tests for lipids and cholesterol should begin at age 20 and be repeated every 5 years. If your lipid or cholesterol levels are high, you are over 50, or you are at high risk for heart disease, you may need your cholesterol levels checked more frequently.Ongoing high lipid and cholesterol levels should be treated with medicines if diet and exercise are not effective.  If you smoke, find out from your caregiver how to quit. If you do not use tobacco, do not start.  If you are pregnant, do not drink alcohol. If you are breastfeeding, be very cautious about drinking alcohol. If you are not pregnant and choose to drink alcohol, do not exceed 1 drink per day. One drink is considered to be 12 ounces (355 mL) of beer, 5 ounces (148 mL) of wine, or 1.5 ounces (44 mL) of liquor.  Avoid use of street drugs. Do not share needles with anyone. Ask for help if you need support or instructions about stopping the use of drugs.  High blood pressure causes heart disease and increases the risk of stroke. Your blood pressure should be checked at least every 1 to 2 years. Ongoing high blood pressure should be treated with medicines if weight loss and exercise are not effective.  If you are 55 to 63 years old, ask your caregiver if you should take aspirin to prevent strokes.  Diabetes   screening involves taking a blood sample to check your fasting blood sugar level. This should be done once every 3 years, after age 45, if you are within normal weight and without risk factors for diabetes. Testing should be considered at a younger age or be carried out more frequently if you are overweight and have at least 1 risk factor for diabetes.  Breast cancer screening is essential preventive care for women. You should practice "breast  self-awareness." This means understanding the normal appearance and feel of your breasts and may include breast self-examination. Any changes detected, no matter how small, should be reported to a caregiver. Women in their 20s and 30s should have a clinical breast exam (CBE) by a caregiver as part of a regular health exam every 1 to 3 years. After age 40, women should have a CBE every year. Starting at age 40, women should consider having a mammography (breast X-ray test) every year. Women who have a family history of breast cancer should talk to their caregiver about genetic screening. Women at a high risk of breast cancer should talk to their caregivers about having magnetic resonance imaging (MRI) and a mammography every year.  The Pap test is a screening test for cervical cancer. A Pap test can show cell changes on the cervix that might become cervical cancer if left untreated. A Pap test is a procedure in which cells are obtained and examined from the lower end of the uterus (cervix).  Women should have a Pap test starting at age 21.  Between ages 21 and 29, Pap tests should be repeated every 2 years.  Beginning at age 30, you should have a Pap test every 3 years as long as the past 3 Pap tests have been normal.  Some women have medical problems that increase the chance of getting cervical cancer. Talk to your caregiver about these problems. It is especially important to talk to your caregiver if a new problem develops soon after your last Pap test. In these cases, your caregiver may recommend more frequent screening and Pap tests.  The above recommendations are the same for women who have or have not gotten the vaccine for human papillomavirus (HPV).  If you had a hysterectomy for a problem that was not cancer or a condition that could lead to cancer, then you no longer need Pap tests. Even if you no longer need a Pap test, a regular exam is a good idea to make sure no other problems are  starting.  If you are between ages 65 and 70, and you have had normal Pap tests going back 10 years, you no longer need Pap tests. Even if you no longer need a Pap test, a regular exam is a good idea to make sure no other problems are starting.  If you have had past treatment for cervical cancer or a condition that could lead to cancer, you need Pap tests and screening for cancer for at least 20 years after your treatment.  If Pap tests have been discontinued, risk factors (such as a new sexual partner) need to be reassessed to determine if screening should be resumed.  The HPV test is an additional test that may be used for cervical cancer screening. The HPV test looks for the virus that can cause the cell changes on the cervix. The cells collected during the Pap test can be tested for HPV. The HPV test could be used to screen women aged 30 years and older, and should   be used in women of any age who have unclear Pap test results. After the age of 30, women should have HPV testing at the same frequency as a Pap test.  Colorectal cancer can be detected and often prevented. Most routine colorectal cancer screening begins at the age of 50 and continues through age 75. However, your caregiver may recommend screening at an earlier age if you have risk factors for colon cancer. On a yearly basis, your caregiver may provide home test kits to check for hidden blood in the stool. Use of a small camera at the end of a tube, to directly examine the colon (sigmoidoscopy or colonoscopy), can detect the earliest forms of colorectal cancer. Talk to your caregiver about this at age 50, when routine screening begins. Direct examination of the colon should be repeated every 5 to 10 years through age 75, unless early forms of pre-cancerous polyps or small growths are found.  Hepatitis C blood testing is recommended for all people born from 1945 through 1965 and any individual with known risks for hepatitis C.  Practice  safe sex. Use condoms and avoid high-risk sexual practices to reduce the spread of sexually transmitted infections (STIs). STIs include gonorrhea, chlamydia, syphilis, trichomonas, herpes, HPV, and human immunodeficiency virus (HIV). Herpes, HIV, and HPV are viral illnesses that have no cure. They can result in disability, cancer, and death. Sexually active women aged 25 and younger should be checked for chlamydia. Older women with new or multiple partners should also be tested for chlamydia. Testing for other STIs is recommended if you are sexually active and at increased risk.  Osteoporosis is a disease in which the bones lose minerals and strength with aging. This can result in serious bone fractures. The risk of osteoporosis can be identified using a bone density scan. Women ages 65 and over and women at risk for fractures or osteoporosis should discuss screening with their caregivers. Ask your caregiver whether you should take a calcium supplement or vitamin D to reduce the rate of osteoporosis.  Menopause can be associated with physical symptoms and risks. Hormone replacement therapy is available to decrease symptoms and risks. You should talk to your caregiver about whether hormone replacement therapy is right for you.  Use sunscreen with sun protection factor (SPF) of 30 or more. Apply sunscreen liberally and repeatedly throughout the day. You should seek shade when your shadow is shorter than you. Protect yourself by wearing long sleeves, pants, a wide-brimmed hat, and sunglasses year round, whenever you are outdoors.  Once a month, do a whole body skin exam, using a mirror to look at the skin on your back. Notify your caregiver of new moles, moles that have irregular borders, moles that are larger than a pencil eraser, or moles that have changed in shape or color.  Stay current with required immunizations.  Influenza. You need a dose every fall (or winter). The composition of the flu vaccine  changes each year, so being vaccinated once is not enough.  Pneumococcal polysaccharide. You need 1 to 2 doses if you smoke cigarettes or if you have certain chronic medical conditions. You need 1 dose at age 65 (or older) if you have never been vaccinated.  Tetanus, diphtheria, pertussis (Tdap, Td). Get 1 dose of Tdap vaccine if you are younger than age 65, are over 65 and have contact with an infant, are a healthcare worker, are pregnant, or simply want to be protected from whooping cough. After that, you need a Td   booster dose every 10 years. Consult your caregiver if you have not had at least 3 tetanus and diphtheria-containing shots sometime in your life or have a deep or dirty wound.  HPV. You need this vaccine if you are a woman age 26 or younger. The vaccine is given in 3 doses over 6 months.  Measles, mumps, rubella (MMR). You need at least 1 dose of MMR if you were born in 1957 or later. You may also need a second dose.  Meningococcal. If you are age 19 to 21 and a first-year college student living in a residence hall, or have one of several medical conditions, you need to get vaccinated against meningococcal disease. You may also need additional booster doses.  Zoster (shingles). If you are age 60 or older, you should get this vaccine.  Varicella (chickenpox). If you have never had chickenpox or you were vaccinated but received only 1 dose, talk to your caregiver to find out if you need this vaccine.  Hepatitis A. You need this vaccine if you have a specific risk factor for hepatitis A virus infection or you simply wish to be protected from this disease. The vaccine is usually given as 2 doses, 6 to 18 months apart.  Hepatitis B. You need this vaccine if you have a specific risk factor for hepatitis B virus infection or you simply wish to be protected from this disease. The vaccine is given in 3 doses, usually over 6 months. Preventive Services / Frequency Ages 19 to 39  Blood  pressure check.** / Every 1 to 2 years.  Lipid and cholesterol check.** / Every 5 years beginning at age 20.  Clinical breast exam.** / Every 3 years for women in their 20s and 30s.  Pap test.** / Every 2 years from ages 21 through 29. Every 3 years starting at age 30 through age 65 or 70 with a history of 3 consecutive normal Pap tests.  HPV screening.** / Every 3 years from ages 30 through ages 65 to 70 with a history of 3 consecutive normal Pap tests.  Hepatitis C blood test.** / For any individual with known risks for hepatitis C.  Skin self-exam. / Monthly.  Influenza immunization.** / Every year.  Pneumococcal polysaccharide immunization.** / 1 to 2 doses if you smoke cigarettes or if you have certain chronic medical conditions.  Tetanus, diphtheria, pertussis (Tdap, Td) immunization. / A one-time dose of Tdap vaccine. After that, you need a Td booster dose every 10 years.  HPV immunization. / 3 doses over 6 months, if you are 26 and younger.  Measles, mumps, rubella (MMR) immunization. / You need at least 1 dose of MMR if you were born in 1957 or later. You may also need a second dose.  Meningococcal immunization. / 1 dose if you are age 19 to 21 and a first-year college student living in a residence hall, or have one of several medical conditions, you need to get vaccinated against meningococcal disease. You may also need additional booster doses.  Varicella immunization.** / Consult your caregiver.  Hepatitis A immunization.** / Consult your caregiver. 2 doses, 6 to 18 months apart.  Hepatitis B immunization.** / Consult your caregiver. 3 doses usually over 6 months. Ages 40 to 64  Blood pressure check.** / Every 1 to 2 years.  Lipid and cholesterol check.** / Every 5 years beginning at age 20.  Clinical breast exam.** / Every year after age 40.  Mammogram.** / Every year beginning at age 40   and continuing for as long as you are in good health. Consult with your  caregiver.  Pap test.** / Every 3 years starting at age 30 through age 65 or 70 with a history of 3 consecutive normal Pap tests.  HPV screening.** / Every 3 years from ages 30 through ages 65 to 70 with a history of 3 consecutive normal Pap tests.  Fecal occult blood test (FOBT) of stool. / Every year beginning at age 50 and continuing until age 75. You may not need to do this test if you get a colonoscopy every 10 years.  Flexible sigmoidoscopy or colonoscopy.** / Every 5 years for a flexible sigmoidoscopy or every 10 years for a colonoscopy beginning at age 50 and continuing until age 75.  Hepatitis C blood test.** / For all people born from 1945 through 1965 and any individual with known risks for hepatitis C.  Skin self-exam. / Monthly.  Influenza immunization.** / Every year.  Pneumococcal polysaccharide immunization.** / 1 to 2 doses if you smoke cigarettes or if you have certain chronic medical conditions.  Tetanus, diphtheria, pertussis (Tdap, Td) immunization.** / A one-time dose of Tdap vaccine. After that, you need a Td booster dose every 10 years.  Measles, mumps, rubella (MMR) immunization. / You need at least 1 dose of MMR if you were born in 1957 or later. You may also need a second dose.  Varicella immunization.** / Consult your caregiver.  Meningococcal immunization.** / Consult your caregiver.  Hepatitis A immunization.** / Consult your caregiver. 2 doses, 6 to 18 months apart.  Hepatitis B immunization.** / Consult your caregiver. 3 doses, usually over 6 months. Ages 65 and over  Blood pressure check.** / Every 1 to 2 years.  Lipid and cholesterol check.** / Every 5 years beginning at age 20.  Clinical breast exam.** / Every year after age 40.  Mammogram.** / Every year beginning at age 40 and continuing for as long as you are in good health. Consult with your caregiver.  Pap test.** / Every 3 years starting at age 30 through age 65 or 70 with a 3  consecutive normal Pap tests. Testing can be stopped between 65 and 70 with 3 consecutive normal Pap tests and no abnormal Pap or HPV tests in the past 10 years.  HPV screening.** / Every 3 years from ages 30 through ages 65 or 70 with a history of 3 consecutive normal Pap tests. Testing can be stopped between 65 and 70 with 3 consecutive normal Pap tests and no abnormal Pap or HPV tests in the past 10 years.  Fecal occult blood test (FOBT) of stool. / Every year beginning at age 50 and continuing until age 75. You may not need to do this test if you get a colonoscopy every 10 years.  Flexible sigmoidoscopy or colonoscopy.** / Every 5 years for a flexible sigmoidoscopy or every 10 years for a colonoscopy beginning at age 50 and continuing until age 75.  Hepatitis C blood test.** / For all people born from 1945 through 1965 and any individual with known risks for hepatitis C.  Osteoporosis screening.** / A one-time screening for women ages 65 and over and women at risk for fractures or osteoporosis.  Skin self-exam. / Monthly.  Influenza immunization.** / Every year.  Pneumococcal polysaccharide immunization.** / 1 dose at age 65 (or older) if you have never been vaccinated.  Tetanus, diphtheria, pertussis (Tdap, Td) immunization. / A one-time dose of Tdap vaccine if you are over   65 and have contact with an infant, are a healthcare worker, or simply want to be protected from whooping cough. After that, you need a Td booster dose every 10 years.  Varicella immunization.** / Consult your caregiver.  Meningococcal immunization.** / Consult your caregiver.  Hepatitis A immunization.** / Consult your caregiver. 2 doses, 6 to 18 months apart.  Hepatitis B immunization.** / Check with your caregiver. 3 doses, usually over 6 months. ** Family history and personal history of risk and conditions may change your caregiver's recommendations. Document Released: 02/20/2001 Document Revised: 03/19/2011  Document Reviewed: 05/22/2010 [employer] Patient Information 2013 [employer].  

## 2012-05-19 NOTE — Progress Notes (Signed)
  Subjective:     Sarah Durham is a 62 y.o. female and is here for a comprehensive physical exam. The patient reports no problems.  History   Social History  . Marital Status: Married    Spouse Name: N/A    Number of Children: N/A  . Years of Education: N/A   Occupational History  . claims [employer]   Social History Main Topics  . Smoking status: Never Smoker   . Smokeless tobacco: Never Used  . Alcohol Use: No  . Drug Use: No  . Sexually Active: Yes -- Female partner(s)   Other Topics Concern  . Not on file   Social History Narrative   Exercise --wellness coach ,  Walking 2 days a week   Health Maintenance  Topic Date Due  . Pap Smear  11/12/2011  . Influenza Vaccine  09/08/2012  . Mammogram  12/24/2013  . Tetanus/tdap  09/21/2018  . Colonoscopy  05/20/2022  . Zostavax  Completed    The following portions of the patient's history were reviewed and updated as appropriate: allergies, current medications, past family history, past medical history, past social history, past surgical history and problem list.  Review of Systems Pertinent items are noted in HPI.   Objective:    BP 114/62  Pulse 61  Temp(Src) 98.3 F (36.8 C) (Oral)  Ht 5\' 1"  (1.549 m)  Wt 184 lb 9.6 oz (83.734 kg)  BMI 34.9 kg/m2  SpO2 97% General appearance: alert, cooperative, appears stated age and no distress Head: Normocephalic, without obvious abnormality, atraumatic Eyes: conjunctivae/corneas clear. PERRL, EOM's intact. Fundi benign. Ears: normal TM's and external ear canals both ears Nose: Nares normal. Septum midline. Mucosa normal. No drainage or sinus tenderness. Throat: lips, mucosa, and tongue normal; teeth and gums normal Neck: no adenopathy, no carotid bruit, no JVD, supple, symmetrical, trachea midline and thyroid not enlarged, symmetric, no tenderness/mass/nodules Back: symmetric, no curvature. ROM normal. No CVA tenderness. Lungs: clear to auscultation  bilaterally Breasts: normal appearance, no masses or tenderness Heart: regular rate and rhythm, S1, S2 normal, no murmur, click, rub or gallop Abdomen: soft, non-tender; bowel sounds normal; no masses,  no organomegaly Pelvic: not indicated; status post hysterectomy, negative ROS Extremities: extremities normal, atraumatic, no cyanosis or edema Pulses: 2+ and symmetric Skin: Skin color, texture, turgor normal. No rashes or lesions Lymph nodes: Cervical, supraclavicular, and axillary nodes normal. Neurologic: Alert and oriented X 3, normal strength and tone. Normal symmetric reflexes. Normal coordination and gait psych-- no depression, no anxiety    Assessment:    Healthy female exam.      Plan:    ghm utd Check labs  See After Visit Summary for Counseling Recommendations

## 2012-05-19 NOTE — Assessment & Plan Note (Signed)
Check labs stable 

## 2012-05-20 LAB — CBC WITH DIFFERENTIAL/PLATELET
Basophils Absolute: 0.1 10*3/uL (ref 0.0–0.1)
Eosinophils Absolute: 0.1 10*3/uL (ref 0.0–0.7)
HCT: 39.9 % (ref 36.0–46.0)
Lymphs Abs: 2.5 10*3/uL (ref 0.7–4.0)
MCHC: 35.1 g/dL (ref 30.0–36.0)
MCV: 87.3 fl (ref 78.0–100.0)
Monocytes Absolute: 0.6 10*3/uL (ref 0.1–1.0)
Monocytes Relative: 8 % (ref 3.0–12.0)
Platelets: 334 10*3/uL (ref 150.0–400.0)
RDW: 13.7 % (ref 11.5–14.6)

## 2012-05-20 LAB — BASIC METABOLIC PANEL
BUN: 14 mg/dL (ref 6–23)
CO2: 24 mEq/L (ref 19–32)
GFR: 91.47 mL/min (ref >60.00)
Glucose, Bld: 90 mg/dL (ref 70–99)
Potassium: 3.9 mEq/L (ref 3.5–5.1)

## 2012-05-20 LAB — POCT URINALYSIS DIPSTICK
Blood, UA: NEGATIVE
Nitrite, UA: NEGATIVE
Spec Grav, UA: 1.015
Urobilinogen, UA: 0.2
pH, UA: 6

## 2012-05-20 LAB — LIPID PANEL
Cholesterol: 173 mg/dL (ref 0–200)
HDL: 39.2 mg/dL (ref >39.00)
Total CHOL/HDL Ratio: 4
Triglycerides: 247 mg/dL — ABNORMAL HIGH (ref 0.0–149.0)
VLDL: 49.4 mg/dL — ABNORMAL HIGH (ref 0.0–40.0)

## 2012-05-20 LAB — HEPATIC FUNCTION PANEL
Albumin: 3.9 g/dL (ref 3.5–5.2)
Total Bilirubin: 0.5 mg/dL (ref 0.3–1.2)

## 2012-05-28 ENCOUNTER — Encounter: Payer: 59 | Admitting: Internal Medicine

## 2012-06-03 ENCOUNTER — Ambulatory Visit: Payer: 59 | Admitting: Family Medicine

## 2012-06-04 ENCOUNTER — Ambulatory Visit (INDEPENDENT_AMBULATORY_CARE_PROVIDER_SITE_OTHER): Payer: 59 | Admitting: Family Medicine

## 2012-06-04 ENCOUNTER — Encounter: Payer: Self-pay | Admitting: Family Medicine

## 2012-06-04 VITALS — BP 108/62 | HR 78 | Temp 99.1°F | Wt 183.0 lb

## 2012-06-04 DIAGNOSIS — E781 Pure hyperglyceridemia: Secondary | ICD-10-CM

## 2012-06-04 DIAGNOSIS — E785 Hyperlipidemia, unspecified: Secondary | ICD-10-CM

## 2012-06-04 DIAGNOSIS — I10 Essential (primary) hypertension: Secondary | ICD-10-CM

## 2012-06-04 MED ORDER — FENOFIBRATE MICRONIZED 90 MG PO CAPS: 1.0000 | ORAL_CAPSULE | Freq: Every day | ORAL | Status: AC

## 2012-06-04 MED ORDER — VERAPAMIL HCL 80 MG PO TABS: 80.0000 mg | ORAL_TABLET | Freq: Three times a day (TID) | ORAL | Status: DC

## 2012-06-04 MED ORDER — LOVASTATIN 20 MG PO TABS: ORAL_TABLET | ORAL | Status: AC

## 2012-06-04 MED ORDER — ATENOLOL 50 MG PO TABS: 50.0000 mg | ORAL_TABLET | Freq: Every day | ORAL | Status: DC

## 2012-06-04 NOTE — Assessment & Plan Note (Signed)
stable °

## 2012-06-04 NOTE — Patient Instructions (Signed)
Triglycerides, TG, TRIG This is a test to check your risk of developing heart disease. It is often done as part of a lipid profile during a regular medical exam or if you are being treated for high triglycerides. This test measures the amount of triglycerides in your blood. Triglycerides are the body's storage form for fat. Most triglycerides are found in fat tissue. Some triglycerides circulate in the blood to provide fuel for muscles to work. Extra triglycerides are found in the blood after eating a meal when fat is being sent from the gut to fat tissue for storage. The test for triglycerides should be done when you are fasting and no extra triglycerides from a recent meal are present.  SAMPLE COLLECTION The test for triglycerides uses a blood sample. Most often, the blood sample is collected using a needle to collect blood from a vein. Sometimes triglycerides are measured using a drop of blood collected by puncturing the skin on a finger. Testing should be done when you are fasting. For 12 to 14 hours before the test, only water is permitted. In addition, alcohol should not be consumed for the 24 hours just before the test. If you are diabetic and your blood sugar is out of control, triglycerides will be very high. NORMAL FINDINGS  Adult/elderly  Female: 40-160 mg/dL or 0.45-1.81 mmol/L (SI units)  Female: 35-135 mg/dL or 0.40-1.52 mmol/L (SI units)  0-63 years  Female: 30-86 mg/dL  Female: 32-99 mg/dL  6-63 years  Female: 31-108 mg/dL  Female: 35-114 mg/dL  12-63 years  Female: 36-138 mg/dL  Female: 41-138 mg/dL  16-63 years  Female: 40-163 mg/dL  Female: 40-128 mg/dL Ranges for normal findings may vary among different laboratories and hospitals. You should always check with your doctor after having lab work or other tests done to discuss the meaning of your test results and whether your values are considered within normal limits. MEANING OF TEST  Your caregiver will go over the test  results with you and discuss the importance and meaning of your results, as well as treatment options and the need for additional tests if necessary. OBTAINING THE TEST RESULTS It is your responsibility to obtain your test results. Ask the lab or department performing the test when and how you will get your results. Document Released: 01/28/2004 Document Revised: 03/19/2011 Document Reviewed: 12/07/2007 [employer] Patient Information 2014 [employer].  

## 2012-06-04 NOTE — Progress Notes (Signed)
  Subjective:    Patient here for follow-up of elevated blood pressure.  She is not exercising and is adherent to a low-salt diet.  Blood pressure is well controlled at home. Cardiac symptoms: none. Patient denies: chest pain, chest pressure/discomfort, claudication, dyspnea, exertional chest pressure/discomfort, fatigue, irregular heart beat, lower extremity edema, near-syncope, orthopnea, palpitations, paroxysmal nocturnal dyspnea, syncope and tachypnea. Cardiovascular risk factors: dyslipidemia, hypertension, obesity (BMI >= 30 kg/m2) and sedentary lifestyle. Use of agents associated with hypertension: none. History of target organ damage: none.  The following portions of the patient's history were reviewed and updated as appropriate: allergies, current medications, past family history, past medical history, past social history, past surgical history and problem list.  Review of Systems Pertinent items are noted in HPI.     Objective:    BP 108/62  Pulse 78  Temp(Src) 99.1 F (37.3 C) (Oral)  Wt 183 lb (83.008 kg)  BMI 34.6 kg/m2  SpO2 97% General appearance: alert, cooperative, appears stated age and no distress Lungs: clear to auscultation bilaterally Heart: S1, S2 normal Extremities: extremities normal, atraumatic, no cyanosis or edema    Assessment:    Hypertension, normal blood pressure -- running low --Evidence of target organ damage: none.    Plan:    Medication: decrease to calan 80 mg 1 po tid . Dietary sodium restriction. Regular aerobic exercise. Check blood pressures 2-3 times weekly and record. Follow up: 3 months and as needed.

## 2012-06-04 NOTE — Assessment & Plan Note (Signed)
Check labs 

## 2012-06-05 ENCOUNTER — Other Ambulatory Visit (INDEPENDENT_AMBULATORY_CARE_PROVIDER_SITE_OTHER): Payer: 59

## 2012-06-05 DIAGNOSIS — Z0000 Encounter for general adult medical examination without abnormal findings: Secondary | ICD-10-CM

## 2012-08-07 ENCOUNTER — Telehealth: Payer: Self-pay | Admitting: Family Medicine

## 2012-08-07 MED ORDER — HYDROCHLOROTHIAZIDE 12.5 MG PO CAPS: 12.5000 mg | ORAL_CAPSULE | Freq: Every day | ORAL | Status: DC

## 2012-08-07 NOTE — Telephone Encounter (Signed)
Patient is calling to request a refill for 90-days on her hydrochlorothiazide (MICROZIDE) 12.5 MG capsule medication. Patient had her physical 05.12.14 and states that this was the only medication of hers we forgot to refill.

## 2017-09-05 ENCOUNTER — Emergency Department (HOSPITAL_COMMUNITY): Admission: EM | Admit: 2017-09-05 | Discharge: 2017-09-05 | Discharge status: Absent without leave | Payer: Self-pay

## 2017-09-05 ENCOUNTER — Other Ambulatory Visit (HOSPITAL_COMMUNITY)
Admission: RE | Admit: 2017-09-05 | Discharge: 2017-09-05 | Disposition: A | Discharge status: Home / Self Care | Payer: Non-veteran care | Source: Other Acute Inpatient Hospital | Attending: Emergency Medicine | Admitting: Emergency Medicine

## 2017-09-05 DIAGNOSIS — R509 Fever, unspecified: Secondary | ICD-10-CM | POA: Insufficient documentation

## 2017-09-05 LAB — CBC
HCT: 42.6 % (ref 36.0–46.0)
HEMOGLOBIN: 14.1 g/dL (ref 12.0–15.0)
MCH: 29.8 pg (ref 26.0–34.0)
MCHC: 33.1 g/dL (ref 30.0–36.0)
MCV: 90.1 fL (ref 78.0–100.0)
Platelets: 353 10*3/uL (ref 150–400)
RBC: 4.73 MIL/uL (ref 3.87–5.11)
RDW: 13.4 % (ref 11.5–15.5)
WBC: 7.6 10*3/uL (ref 4.0–10.5)

## 2017-09-10 LAB — CULTURE, BLOOD (ROUTINE X 2)
CULTURE: NO GROWTH
Culture: NO GROWTH
Special Requests: ADEQUATE
Special Requests: ADEQUATE

## 2018-10-26 ENCOUNTER — Emergency Department (HOSPITAL_BASED_OUTPATIENT_CLINIC_OR_DEPARTMENT_OTHER)
Admission: EM | Admit: 2018-10-26 | Discharge: 2018-10-26 | Disposition: A | Discharge status: Home / Self Care | Payer: No Typology Code available for payment source | Attending: Emergency Medicine | Admitting: Emergency Medicine

## 2018-10-26 ENCOUNTER — Other Ambulatory Visit: Payer: Self-pay

## 2018-10-26 ENCOUNTER — Encounter (HOSPITAL_BASED_OUTPATIENT_CLINIC_OR_DEPARTMENT_OTHER): Payer: Self-pay | Admitting: *Deleted

## 2018-10-26 DIAGNOSIS — I10 Essential (primary) hypertension: Secondary | ICD-10-CM | POA: Diagnosis present

## 2018-10-26 DIAGNOSIS — Z79899 Other long term (current) drug therapy: Secondary | ICD-10-CM | POA: Diagnosis not present

## 2018-10-26 DIAGNOSIS — Z7982 Long term (current) use of aspirin: Secondary | ICD-10-CM | POA: Diagnosis not present

## 2018-10-26 HISTORY — DX: Endometriosis, unspecified: N80.9

## 2018-10-26 MED ORDER — VERAPAMIL HCL 80 MG PO TABS: 80.0000 mg | ORAL_TABLET | Freq: Three times a day (TID) | ORAL | 0 refills | Status: AC

## 2018-10-26 MED ORDER — HYDROCHLOROTHIAZIDE 12.5 MG PO CAPS: 12.5000 mg | ORAL_CAPSULE | Freq: Every day | ORAL | 0 refills | Status: AC

## 2018-10-26 MED ORDER — ATENOLOL 50 MG PO TABS: 50.0000 mg | ORAL_TABLET | Freq: Every day | ORAL | 0 refills | Status: AC

## 2018-10-26 NOTE — ED Triage Notes (Addendum)
Pt states she has been out of her BP meds for a few days. She gets her meds through the [place] and they have not gotten her medications in yet. She states her BP was up this morning and she "felt flushed". Denies Chest pain, dizziness at present

## 2018-10-26 NOTE — ED Provider Notes (Signed)
[facility] EMERGENCY DEPARTMENT Provider Note   CSN: [id number] Arrival date & time: 10/26/18  1944     History   Chief Complaint Chief Complaint  Patient presents with  . Hypertension    HPI Sarah Durham is a 68 y.o. female.     Patient is a 68 year old female who presents with elevated blood pressure.  Sarah Durham states that Sarah Durham gets her medications through the [facility] hospital mail order system.  They been running a little bit late and Sarah Durham has been out of her medications for the last few days.  Sarah Durham feels like they are gone and come in in the next day or 2 but Sarah Durham was not able to go pick up any extra medications over the weekend.  This evening Sarah Durham had one episode where Sarah Durham felt a little flushed.  Sarah Durham checked her blood pressure is elevated.  Sarah Durham is otherwise completely asymptomatic.  Sarah Durham feels completely back to baseline now.  No headaches.  No chest pain or shortness of breath.  No recent illnesses.     Past Medical History:  Diagnosis Date  . Branch retinal vein occlusion   . Endometriosis   . Hyperglycemia   . Hyperlipidemia   . Hypertension   . Meckel's diverticulum   . Myalgia   . Visual field defect     Patient Active Problem List   Diagnosis Date Noted  . CAROTID BRUIT 11/04/2009  . PALPITATIONS, RECURRENT 10/19/2009  . COUGH 10/19/2009  . UNSPECIFIED VITAMIN D DEFICIENCY 11/11/2008  . BRANCH RETINAL VEIN OCCLUSION 11/11/2008  . MECKEL'S DIVERTICULUM 11/11/2008  . VISUAL FIELD DEFECT 01/28/2008  . MYALGIA 01/28/2008  . HYPERLIPIDEMIA 09/16/2006  . HYPERTENSION 09/16/2006  . FASTING HYPERGLYCEMIA 09/16/2006    Past Surgical History:  Procedure Laterality Date  . ABDOMINAL HYSTERECTOMY     TAH/BSO  . BREAST LUMPECTOMY  1981   x4  . TONSILLECTOMY  1955     OB History   No obstetric history on file.      Home Medications    Prior to Admission medications   Medication Sig Start Date End Date Taking? Authorizing Provider  aspirin 81 MG tablet  Take 81 mg by mouth daily.     Yes [provider]  atenolol (TENORMIN) 50 MG tablet Take 1 tablet (50 mg total) by mouth daily. 10/26/18   Rolan Bucco, MD  Calcium Carbonate-Vit D-Min (CALCIUM 1200 PO) Take 1 tablet by mouth daily.    [provider]  Cholecalciferol (VITAMIN D3) 1000 UNITS CAPS Take 2 capsules by mouth.     [provider]  Fenofibrate Micronized (ANTARA) 90 MG CAPS Take 1 capsule by mouth daily. 06/04/12   Seabron Spates R, DO  fish oil-omega-3 fatty acids 1000 MG capsule Take 1 g by mouth daily.     [provider]  hydrochlorothiazide (MICROZIDE) 12.5 MG capsule Take 1 capsule (12.5 mg total) by mouth daily. 10/26/18   Rolan Bucco, MD  lovastatin (MEVACOR) 20 MG tablet 2 po qhs 06/04/12   Zola Button, Grayling Congress, DO  lovastatin (MEVACOR) 40 MG tablet Take 1 tablet (40 mg total) by mouth at bedtime. 05/19/12   Donato Schultz, DO  Multiple Vitamin (MULTIVITAMIN) tablet Take 1 tablet by mouth daily.     [provider]  verapamil (CALAN) 80 MG tablet Take 1 tablet (80 mg total) by mouth 3 (three) times daily. 10/26/18   Rolan Bucco, MD    Family History Family History  Problem Relation Age of Onset  . Dementia Mother   . Alzheimer's disease Mother   . Dementia Father   . Diabetes Other   . Hyperlipidemia Other   . Hypertension Other   . Arthritis Other   . Coronary artery disease Other   . Prostate cancer Maternal Grandfather   . Alcohol abuse Other   . Depression Other     Social History Social History   Tobacco Use  . Smoking status: Never Smoker  . Smokeless tobacco: Never Used  Substance Use Topics  . Alcohol use: No  . Drug use: No     Allergies   Pravastatin   Review of Systems Review of Systems  Constitutional: Negative for chills, diaphoresis, fatigue and fever.  HENT: Negative for congestion, rhinorrhea and sneezing.   Eyes: Negative.   Respiratory: Negative for cough, chest  tightness and shortness of breath.   Cardiovascular: Negative for chest pain and leg swelling.  Gastrointestinal: Negative for abdominal pain, blood in stool, diarrhea, nausea and vomiting.  Genitourinary: Negative for difficulty urinating, flank pain, frequency and hematuria.  Musculoskeletal: Negative for arthralgias and back pain.  Skin: Negative for rash.  Neurological: Negative for dizziness, speech difficulty, weakness, numbness and headaches.     Physical Exam Updated Vital Signs BP (!) 169/94 (BP Location: Right Arm)   Pulse 99   Temp 98.7 F (37.1 C) (Oral)   Resp 17   Ht 5\' 1"  (1.549 m)   Wt 78.9 kg   SpO2 99%   BMI 32.88 kg/m   Physical Exam Constitutional:      Appearance: Sarah Durham is well-developed.  HENT:     Head: Normocephalic and atraumatic.  Eyes:     Pupils: Pupils are equal, round, and reactive to light.  Neck:     Musculoskeletal: Normal range of motion and neck supple.  Cardiovascular:     Rate and Rhythm: Normal rate and regular rhythm.     Heart sounds: Normal heart sounds.  Pulmonary:     Effort: Pulmonary effort is normal. No respiratory distress.     Breath sounds: Normal breath sounds. No wheezing or rales.  Chest:     Chest wall: No tenderness.  Abdominal:     General: Bowel sounds are normal.     Palpations: Abdomen is soft.     Tenderness: There is no abdominal tenderness. There is no guarding or rebound.  Musculoskeletal: Normal range of motion.        General: No swelling.  Lymphadenopathy:     Cervical: No cervical adenopathy.  Skin:    General: Skin is warm and dry.     Findings: No rash.  Neurological:     Mental Status: Sarah Durham is alert and oriented to person, place, and time.      ED Treatments / Results  Labs (all labs ordered are listed, but only abnormal results are displayed) Labs Reviewed - No data to display  EKG EKG Interpretation  Date/Time:  Sunday October 26 2018 22:13:43 EDT Ventricular Rate:  91 PR Interval:     QRS Duration: 93 QT Interval:  385 QTC Calculation: 474 R Axis:   -6 Text Interpretation:  Sinus rhythm Low voltage, precordial leads since last tracing no significant change Confirmed by Rolan BuccoBelfi, Roneisha Stern [id number]) on 10/26/2018 10:47:13 PM   Radiology No results found.  Procedures Procedures (including critical care time)  Medications Ordered in ED Medications - No data to display   Initial Impression / Assessment and Plan / ED Course  I have reviewed the triage vital signs and the nursing notes.  Pertinent labs & imaging results that were available during my care of the patient were reviewed by me and considered in my medical decision making (see chart for details).       Patient presents with elevated blood pressure.  Sarah Durham has been out of her medications for the last few days.  Sarah Durham is asymptomatic.  Her EKG is nonconcerning.  I did not feel that Sarah Durham needed any blood work given that Sarah Durham is asymptomatic.  Sarah Durham was given a 7-day supply of her medications and these were sent to the 24-hour pharmacy.  Sarah Durham was advised to follow-up with her PCP for any other ongoing symptoms.   Final Clinical Impressions(s) / ED Diagnoses   Final diagnoses:  Essential hypertension    ED Discharge Orders         Ordered    atenolol (TENORMIN) 50 MG tablet  Daily     10/26/18 2248    hydrochlorothiazide (MICROZIDE) 12.5 MG capsule  Daily     10/26/18 2248    verapamil (CALAN) 80 MG tablet  3 times daily     10/26/18 2248           Malvin Johns, MD 10/26/18 2256

## 2018-10-26 NOTE — ED Notes (Signed)
Pt reports being out of HCTZ and verapamil x 4 days. Pt denies HA, blurred vision, chest pain. Pt has no swelling. Pt reports feeling flushed this afternoon and then checking BP and it was elevated. Pt is seen at [facility] and receives med in the mail. meds were due Saturday but did not come.
